# Patient Record
Sex: Male | Born: 1940 | Race: Black or African American | Hispanic: No | Marital: Married | Smoking: Never smoker
Health system: Southern US, Community
[De-identification: ages and names within clinical notes are randomized; demographics above are authoritative.]

## PROBLEM LIST (undated history)

## (undated) DIAGNOSIS — I10 Essential (primary) hypertension: Secondary | ICD-10-CM

## (undated) DIAGNOSIS — R569 Unspecified convulsions: Secondary | ICD-10-CM

## (undated) DIAGNOSIS — C61 Malignant neoplasm of prostate: Secondary | ICD-10-CM

## (undated) DIAGNOSIS — N179 Acute kidney failure, unspecified: Secondary | ICD-10-CM

## (undated) DIAGNOSIS — Q600 Renal agenesis, unilateral: Secondary | ICD-10-CM

## (undated) DIAGNOSIS — F1020 Alcohol dependence, uncomplicated: Secondary | ICD-10-CM

## (undated) DIAGNOSIS — I4510 Unspecified right bundle-branch block: Secondary | ICD-10-CM

## (undated) DIAGNOSIS — IMO0002 Reserved for concepts with insufficient information to code with codable children: Secondary | ICD-10-CM

## (undated) DIAGNOSIS — A0472 Enterocolitis due to Clostridium difficile, not specified as recurrent: Secondary | ICD-10-CM

## (undated) DIAGNOSIS — E785 Hyperlipidemia, unspecified: Secondary | ICD-10-CM

## (undated) DIAGNOSIS — N471 Phimosis: Secondary | ICD-10-CM

## (undated) HISTORY — PX: OTHER SURGICAL HISTORY: SHX169

---

## 2003-11-14 ENCOUNTER — Other Ambulatory Visit: Payer: Self-pay

## 2006-04-16 ENCOUNTER — Inpatient Hospital Stay: Payer: Self-pay | Admitting: Internal Medicine

## 2006-04-16 ENCOUNTER — Other Ambulatory Visit: Payer: Self-pay

## 2006-07-09 ENCOUNTER — Ambulatory Visit: Payer: Self-pay | Admitting: Gastroenterology

## 2006-07-16 ENCOUNTER — Ambulatory Visit: Payer: Self-pay | Admitting: Gastroenterology

## 2013-07-20 ENCOUNTER — Ambulatory Visit: Payer: Self-pay | Admitting: Physician Assistant

## 2013-07-20 LAB — CBC WITH DIFFERENTIAL/PLATELET
BASOS PCT: 0.4 %
Basophil #: 0.1 10*3/uL (ref 0.0–0.1)
Comment - H1-Com3: NORMAL
EOS ABS: 0 10*3/uL (ref 0.0–0.7)
Eosinophil %: 0.1 %
HCT: 42.8 % (ref 40.0–52.0)
HGB: 14 g/dL (ref 13.0–18.0)
LYMPHS ABS: 1.4 10*3/uL (ref 1.0–3.6)
LYMPHS PCT: 6 %
Lymphocyte %: 4.8 %
MCH: 30.5 pg (ref 26.0–34.0)
MCHC: 32.6 g/dL (ref 32.0–36.0)
MCV: 94 fL (ref 80–100)
MONO ABS: 2.6 x10 3/mm — AB (ref 0.2–1.0)
MONOS PCT: 8.6 %
Monocytes: 8 %
Myelocyte: 2 %
Neutrophil #: 25.7 10*3/uL — ABNORMAL HIGH (ref 1.4–6.5)
Neutrophil %: 86.1 %
PLATELETS: 463 10*3/uL — AB (ref 150–440)
RBC: 4.57 10*6/uL (ref 4.40–5.90)
RDW: 14.3 % (ref 11.5–14.5)
Segmented Neutrophils: 83 %
Variant Lymphocyte - H1-Rlymph: 1 %
WBC: 29.8 10*3/uL — AB (ref 3.8–10.6)

## 2013-07-20 LAB — COMPREHENSIVE METABOLIC PANEL
ALK PHOS: 120 U/L — AB
ALT: 19 U/L (ref 12–78)
ANION GAP: 15 (ref 7–16)
Albumin: 2.6 g/dL — ABNORMAL LOW (ref 3.4–5.0)
BUN: 36 mg/dL — ABNORMAL HIGH (ref 7–18)
Bilirubin,Total: 0.4 mg/dL (ref 0.2–1.0)
CALCIUM: 10 mg/dL (ref 8.5–10.1)
CHLORIDE: 96 mmol/L — AB (ref 98–107)
CO2: 27 mmol/L (ref 21–32)
CREATININE: 2.96 mg/dL — AB (ref 0.60–1.30)
EGFR (African American): 24 — ABNORMAL LOW
GFR CALC NON AF AMER: 20 — AB
GLUCOSE: 133 mg/dL — AB (ref 65–99)
Osmolality: 286 (ref 275–301)
Potassium: 3 mmol/L — ABNORMAL LOW (ref 3.5–5.1)
SGOT(AST): 16 U/L (ref 15–37)
Sodium: 138 mmol/L (ref 136–145)
Total Protein: 8.6 g/dL — ABNORMAL HIGH (ref 6.4–8.2)

## 2013-07-20 LAB — URINALYSIS, COMPLETE
Bilirubin,UR: NEGATIVE
GLUCOSE, UR: NEGATIVE mg/dL (ref 0–75)
KETONE: NEGATIVE
LEUKOCYTE ESTERASE: NEGATIVE
Nitrite: NEGATIVE
Ph: 6 (ref 4.5–8.0)
RBC,UR: >30 /HPF (ref 0–5)
Specific Gravity: 1.01 (ref 1.003–1.030)

## 2013-07-20 LAB — OCCULT BLOOD X 1 CARD TO LAB, STOOL: OCCULT BLOOD, FECES: NEGATIVE

## 2013-07-25 ENCOUNTER — Inpatient Hospital Stay: Payer: Self-pay | Admitting: Obstetrics and Gynecology

## 2013-07-25 LAB — CBC
HCT: 39.2 % — ABNORMAL LOW (ref 40.0–52.0)
HGB: 13.3 g/dL (ref 13.0–18.0)
MCH: 31.7 pg (ref 26.0–34.0)
MCHC: 33.9 g/dL (ref 32.0–36.0)
MCV: 94 fL (ref 80–100)
Platelet: 498 10*3/uL — ABNORMAL HIGH (ref 150–440)
RBC: 4.19 10*6/uL — AB (ref 4.40–5.90)
RDW: 14.3 % (ref 11.5–14.5)
WBC: 28.1 10*3/uL — ABNORMAL HIGH (ref 3.8–10.6)

## 2013-07-25 LAB — URINALYSIS, COMPLETE
BACTERIA: NONE SEEN
BILIRUBIN, UR: NEGATIVE
Glucose,UR: NEGATIVE mg/dL (ref 0–75)
Hyaline Cast: 6
Nitrite: NEGATIVE
Ph: 6 (ref 4.5–8.0)
Protein: >=500
Specific Gravity: 1.019 (ref 1.003–1.030)
Squamous Epithelial: <1

## 2013-07-25 LAB — COMPREHENSIVE METABOLIC PANEL
ALK PHOS: 126 U/L — AB
Albumin: 2.4 g/dL — ABNORMAL LOW (ref 3.4–5.0)
Anion Gap: 8 (ref 7–16)
BILIRUBIN TOTAL: 0.3 mg/dL (ref 0.2–1.0)
BUN: 21 mg/dL — AB (ref 7–18)
CO2: 29 mmol/L (ref 21–32)
Calcium, Total: 9.4 mg/dL (ref 8.5–10.1)
Chloride: 99 mmol/L (ref 98–107)
Creatinine: 1.52 mg/dL — ABNORMAL HIGH (ref 0.60–1.30)
EGFR (African American): 53 — ABNORMAL LOW
EGFR (Non-African Amer.): 46 — ABNORMAL LOW
Glucose: 121 mg/dL — ABNORMAL HIGH (ref 65–99)
OSMOLALITY: 276 (ref 275–301)
Potassium: 2.9 mmol/L — ABNORMAL LOW (ref 3.5–5.1)
SGOT(AST): 18 U/L (ref 15–37)
SGPT (ALT): 18 U/L (ref 12–78)
Sodium: 136 mmol/L (ref 136–145)
Total Protein: 8.3 g/dL — ABNORMAL HIGH (ref 6.4–8.2)

## 2013-07-25 LAB — LIPASE, BLOOD: LIPASE: 126 U/L (ref 73–393)

## 2013-07-25 LAB — CLOSTRIDIUM DIFFICILE(ARMC)

## 2013-07-26 LAB — BASIC METABOLIC PANEL
Anion Gap: 6 — ABNORMAL LOW (ref 7–16)
BUN: 14 mg/dL (ref 7–18)
CALCIUM: 8.9 mg/dL (ref 8.5–10.1)
CREATININE: 1.32 mg/dL — AB (ref 0.60–1.30)
Chloride: 103 mmol/L (ref 98–107)
Co2: 28 mmol/L (ref 21–32)
EGFR (African American): >60
EGFR (Non-African Amer.): 54 — ABNORMAL LOW
Glucose: 104 mg/dL — ABNORMAL HIGH (ref 65–99)
Osmolality: 275 (ref 275–301)
POTASSIUM: 3.3 mmol/L — AB (ref 3.5–5.1)
Sodium: 137 mmol/L (ref 136–145)

## 2013-07-26 LAB — URINE CULTURE

## 2013-07-26 LAB — CBC WITH DIFFERENTIAL/PLATELET
BASOS PCT: 0.2 %
Basophil #: 0.1 10*3/uL (ref 0.0–0.1)
EOS PCT: 0.6 %
Eosinophil #: 0.1 10*3/uL (ref 0.0–0.7)
HCT: 37.3 % — AB (ref 40.0–52.0)
HGB: 12.2 g/dL — ABNORMAL LOW (ref 13.0–18.0)
LYMPHS ABS: 1.1 10*3/uL (ref 1.0–3.6)
Lymphocyte %: 4.4 %
MCH: 31 pg (ref 26.0–34.0)
MCHC: 32.7 g/dL (ref 32.0–36.0)
MCV: 95 fL (ref 80–100)
Monocyte #: 1.9 x10 3/mm — ABNORMAL HIGH (ref 0.2–1.0)
Monocyte %: 7.4 %
NEUTROS ABS: 21.9 10*3/uL — AB (ref 1.4–6.5)
NEUTROS PCT: 87.4 %
Platelet: 512 10*3/uL — ABNORMAL HIGH (ref 150–440)
RBC: 3.93 10*6/uL — AB (ref 4.40–5.90)
RDW: 14.8 % — AB (ref 11.5–14.5)
WBC: 25.1 10*3/uL — ABNORMAL HIGH (ref 3.8–10.6)

## 2013-07-28 LAB — WBC: WBC: 21 10*3/uL — AB (ref 3.8–10.6)

## 2013-07-30 LAB — CULTURE, BLOOD (SINGLE)

## 2013-08-06 ENCOUNTER — Inpatient Hospital Stay: Payer: Self-pay | Admitting: Family Medicine

## 2013-08-06 LAB — COMPREHENSIVE METABOLIC PANEL
ALT: 15 U/L (ref 12–78)
ANION GAP: 7 (ref 7–16)
AST: 15 U/L (ref 15–37)
Albumin: 2.9 g/dL — ABNORMAL LOW (ref 3.4–5.0)
Alkaline Phosphatase: 120 U/L — ABNORMAL HIGH
BILIRUBIN TOTAL: 0.5 mg/dL (ref 0.2–1.0)
BUN: 17 mg/dL (ref 7–18)
CALCIUM: 10.4 mg/dL — AB (ref 8.5–10.1)
Chloride: 101 mmol/L (ref 98–107)
Co2: 26 mmol/L (ref 21–32)
Creatinine: 1.75 mg/dL — ABNORMAL HIGH (ref 0.60–1.30)
EGFR (Non-African Amer.): 39 — ABNORMAL LOW
GFR CALC AF AMER: 45 — AB
Glucose: 115 mg/dL — ABNORMAL HIGH (ref 65–99)
OSMOLALITY: 271 (ref 275–301)
Potassium: 4.1 mmol/L (ref 3.5–5.1)
Sodium: 134 mmol/L — ABNORMAL LOW (ref 136–145)
Total Protein: 9.2 g/dL — ABNORMAL HIGH (ref 6.4–8.2)

## 2013-08-06 LAB — URINALYSIS, COMPLETE
SQUAMOUS EPITHELIAL: NONE SEEN
Specific Gravity: 1.026 (ref 1.003–1.030)
WBC UR: 9908 /HPF (ref 0–5)

## 2013-08-06 LAB — CBC WITH DIFFERENTIAL/PLATELET
Comment - H1-Com1: NORMAL
EOS PCT: 1 %
HCT: 42.4 % (ref 40.0–52.0)
HGB: 13.3 g/dL (ref 13.0–18.0)
LYMPHS PCT: 5 %
MCH: 29.6 pg (ref 26.0–34.0)
MCHC: 31.3 g/dL — ABNORMAL LOW (ref 32.0–36.0)
MCV: 94 fL (ref 80–100)
Monocytes: 5 %
Platelet: 559 10*3/uL — ABNORMAL HIGH (ref 150–440)
RBC: 4.49 10*6/uL (ref 4.40–5.90)
RDW: 14.4 % (ref 11.5–14.5)
Segmented Neutrophils: 87 %
Variant Lymphocyte - H1-Rlymph: 2 %
WBC: 31.1 10*3/uL — AB (ref 3.8–10.6)

## 2013-08-07 LAB — CBC WITH DIFFERENTIAL/PLATELET
Basophil #: 0.1 10*3/uL (ref 0.0–0.1)
Basophil %: 0.7 %
EOS PCT: 2 %
Eosinophil #: 0.3 10*3/uL (ref 0.0–0.7)
HCT: 34.8 % — AB (ref 40.0–52.0)
HGB: 11.4 g/dL — ABNORMAL LOW (ref 13.0–18.0)
LYMPHS ABS: 2.5 10*3/uL (ref 1.0–3.6)
Lymphocyte %: 16.8 %
MCH: 30.8 pg (ref 26.0–34.0)
MCHC: 32.9 g/dL (ref 32.0–36.0)
MCV: 94 fL (ref 80–100)
Monocyte #: 1.4 x10 3/mm — ABNORMAL HIGH (ref 0.2–1.0)
Monocyte %: 9.6 %
NEUTROS PCT: 70.9 %
Neutrophil #: 10.5 10*3/uL — ABNORMAL HIGH (ref 1.4–6.5)
PLATELETS: 436 10*3/uL (ref 150–440)
RBC: 3.71 10*6/uL — AB (ref 4.40–5.90)
RDW: 14.6 % — ABNORMAL HIGH (ref 11.5–14.5)
WBC: 14.8 10*3/uL — ABNORMAL HIGH (ref 3.8–10.6)

## 2013-08-07 LAB — BASIC METABOLIC PANEL
Anion Gap: 7 (ref 7–16)
BUN: 15 mg/dL (ref 7–18)
Calcium, Total: 8.7 mg/dL (ref 8.5–10.1)
Chloride: 105 mmol/L (ref 98–107)
Co2: 23 mmol/L (ref 21–32)
Creatinine: 1.66 mg/dL — ABNORMAL HIGH (ref 0.60–1.30)
EGFR (African American): 48 — ABNORMAL LOW
EGFR (Non-African Amer.): 41 — ABNORMAL LOW
GLUCOSE: 112 mg/dL — AB (ref 65–99)
Osmolality: 272 (ref 275–301)
Potassium: 3.2 mmol/L — ABNORMAL LOW (ref 3.5–5.1)
Sodium: 135 mmol/L — ABNORMAL LOW (ref 136–145)

## 2013-08-07 LAB — CLOSTRIDIUM DIFFICILE(ARMC)

## 2013-08-08 LAB — BASIC METABOLIC PANEL
Anion Gap: 6 — ABNORMAL LOW (ref 7–16)
BUN: 9 mg/dL (ref 7–18)
CO2: 26 mmol/L (ref 21–32)
Calcium, Total: 9.1 mg/dL (ref 8.5–10.1)
Chloride: 106 mmol/L (ref 98–107)
Creatinine: 1.47 mg/dL — ABNORMAL HIGH (ref 0.60–1.30)
EGFR (African American): 55 — ABNORMAL LOW
GFR CALC NON AF AMER: 48 — AB
Glucose: 94 mg/dL (ref 65–99)
OSMOLALITY: 274 (ref 275–301)
POTASSIUM: 3.3 mmol/L — AB (ref 3.5–5.1)
SODIUM: 138 mmol/L (ref 136–145)

## 2013-08-08 LAB — CBC WITH DIFFERENTIAL/PLATELET
BASOS PCT: 0.5 %
Basophil #: 0.1 10*3/uL (ref 0.0–0.1)
EOS PCT: 1.8 %
Eosinophil #: 0.3 10*3/uL (ref 0.0–0.7)
HCT: 30.5 % — AB (ref 40.0–52.0)
HGB: 10 g/dL — AB (ref 13.0–18.0)
Lymphocyte #: 1.5 10*3/uL (ref 1.0–3.6)
Lymphocyte %: 7.6 %
MCH: 30.7 pg (ref 26.0–34.0)
MCHC: 32.8 g/dL (ref 32.0–36.0)
MCV: 94 fL (ref 80–100)
MONO ABS: 1.9 x10 3/mm — AB (ref 0.2–1.0)
MONOS PCT: 9.4 %
Neutrophil #: 15.9 10*3/uL — ABNORMAL HIGH (ref 1.4–6.5)
Neutrophil %: 80.7 %
PLATELETS: 363 10*3/uL (ref 150–440)
RBC: 3.25 10*6/uL — ABNORMAL LOW (ref 4.40–5.90)
RDW: 14.3 % (ref 11.5–14.5)
WBC: 19.7 10*3/uL — ABNORMAL HIGH (ref 3.8–10.6)

## 2013-08-08 LAB — URINE CULTURE

## 2013-08-09 ENCOUNTER — Ambulatory Visit: Payer: Self-pay | Admitting: Urology

## 2013-08-09 LAB — CBC WITH DIFFERENTIAL/PLATELET
BASOS ABS: 0.1 10*3/uL (ref 0.0–0.1)
Basophil %: 0.4 %
EOS PCT: 2.9 %
Eosinophil #: 0.4 10*3/uL (ref 0.0–0.7)
HCT: 29.6 % — ABNORMAL LOW (ref 40.0–52.0)
HGB: 9.9 g/dL — ABNORMAL LOW (ref 13.0–18.0)
Lymphocyte #: 1.3 10*3/uL (ref 1.0–3.6)
Lymphocyte %: 9.4 %
MCH: 31 pg (ref 26.0–34.0)
MCHC: 33.3 g/dL (ref 32.0–36.0)
MCV: 93 fL (ref 80–100)
MONO ABS: 1.5 x10 3/mm — AB (ref 0.2–1.0)
Monocyte %: 11.1 %
Neutrophil #: 10.2 10*3/uL — ABNORMAL HIGH (ref 1.4–6.5)
Neutrophil %: 76.2 %
Platelet: 353 10*3/uL (ref 150–440)
RBC: 3.18 10*6/uL — ABNORMAL LOW (ref 4.40–5.90)
RDW: 14.7 % — ABNORMAL HIGH (ref 11.5–14.5)
WBC: 13.4 10*3/uL — AB (ref 3.8–10.6)

## 2013-08-09 LAB — IRON AND TIBC
IRON: 52 ug/dL — AB (ref 65–175)
Iron Bind.Cap.(Total): 134 ug/dL — ABNORMAL LOW (ref 250–450)
Iron Saturation: 39 %
Unbound Iron-Bind.Cap.: 82 ug/dL

## 2013-08-09 LAB — OCCULT BLOOD X 1 CARD TO LAB, STOOL: OCCULT BLOOD, FECES: NEGATIVE

## 2013-08-09 LAB — FERRITIN: FERRITIN (ARMC): 476 ng/mL — AB (ref 8–388)

## 2013-08-10 LAB — BASIC METABOLIC PANEL
Anion Gap: 3 — ABNORMAL LOW (ref 7–16)
BUN: 3 mg/dL — AB (ref 7–18)
CALCIUM: 8.9 mg/dL (ref 8.5–10.1)
CO2: 28 mmol/L (ref 21–32)
CREATININE: 1.16 mg/dL (ref 0.60–1.30)
Chloride: 110 mmol/L — ABNORMAL HIGH (ref 98–107)
EGFR (African American): >60
EGFR (Non-African Amer.): >60
Glucose: 103 mg/dL — ABNORMAL HIGH (ref 65–99)
Osmolality: 278 (ref 275–301)
POTASSIUM: 3.3 mmol/L — AB (ref 3.5–5.1)
SODIUM: 141 mmol/L (ref 136–145)

## 2013-08-10 LAB — WBC: WBC: 13.5 10*3/uL — AB (ref 3.8–10.6)

## 2013-08-11 LAB — CULTURE, BLOOD (SINGLE)

## 2013-08-11 LAB — BASIC METABOLIC PANEL
Anion Gap: 5 — ABNORMAL LOW (ref 7–16)
BUN: 3 mg/dL — ABNORMAL LOW (ref 7–18)
Calcium, Total: 9.1 mg/dL (ref 8.5–10.1)
Chloride: 110 mmol/L — ABNORMAL HIGH (ref 98–107)
Co2: 26 mmol/L (ref 21–32)
Creatinine: 1.26 mg/dL (ref 0.60–1.30)
EGFR (African American): >60
GFR CALC NON AF AMER: 57 — AB
GLUCOSE: 102 mg/dL — AB (ref 65–99)
Osmolality: 278 (ref 275–301)
POTASSIUM: 3.4 mmol/L — AB (ref 3.5–5.1)
Sodium: 141 mmol/L (ref 136–145)

## 2013-08-12 LAB — CLOSTRIDIUM DIFFICILE(ARMC)

## 2014-05-08 ENCOUNTER — Emergency Department: Payer: Self-pay | Admitting: Emergency Medicine

## 2014-05-08 LAB — CBC
HCT: 43.4 % (ref 40.0–52.0)
HGB: 13.8 g/dL (ref 13.0–18.0)
MCH: 31.6 pg (ref 26.0–34.0)
MCHC: 31.9 g/dL — ABNORMAL LOW (ref 32.0–36.0)
MCV: 99 fL (ref 80–100)
Platelet: 218 10*3/uL (ref 150–440)
RBC: 4.38 10*6/uL — ABNORMAL LOW (ref 4.40–5.90)
RDW: 14 % (ref 11.5–14.5)
WBC: 7.9 10*3/uL (ref 3.8–10.6)

## 2014-05-08 LAB — URINALYSIS, COMPLETE
BACTERIA: NONE SEEN
BILIRUBIN, UR: NEGATIVE
Blood: NEGATIVE
Glucose,UR: NEGATIVE mg/dL (ref 0–75)
Hyaline Cast: 3
Ketone: NEGATIVE
Leukocyte Esterase: NEGATIVE
Nitrite: NEGATIVE
Ph: 5 (ref 4.5–8.0)
Protein: NEGATIVE
Specific Gravity: 1.016 (ref 1.003–1.030)
Squamous Epithelial: <1
WBC UR: 1 /HPF (ref 0–5)

## 2014-05-08 LAB — BASIC METABOLIC PANEL
ANION GAP: 8 (ref 7–16)
BUN: 25 mg/dL — AB (ref 7–18)
Calcium, Total: 8.3 mg/dL — ABNORMAL LOW (ref 8.5–10.1)
Chloride: 108 mmol/L — ABNORMAL HIGH (ref 98–107)
Co2: 22 mmol/L (ref 21–32)
Creatinine: 1.77 mg/dL — ABNORMAL HIGH (ref 0.60–1.30)
EGFR (Non-African Amer.): 41 — ABNORMAL LOW
GFR CALC AF AMER: 49 — AB
Glucose: 79 mg/dL (ref 65–99)
OSMOLALITY: 279 (ref 275–301)
POTASSIUM: 3.8 mmol/L (ref 3.5–5.1)
Sodium: 138 mmol/L (ref 136–145)

## 2014-05-08 LAB — ETHANOL: ETHANOL LVL: 48 mg/dL

## 2014-09-19 NOTE — Discharge Summary (Signed)
PATIENT NAME:  [name], [name] MR#:  [id number] DATE OF BIRTH:  01-13-1941  DATE OF ADMISSION:  07/25/2013 DATE OF DISCHARGE:  07/28/2013  PRESENTING COMPLAINT: Difficulty urinating and abdominal pain.   DISCHARGE DIAGNOSES:  1.  Severe urinary tract infection. 2.  Acute on chronic renal failure, improved.  3.  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  4.  Hypertension.   CODE STATUS: Full code.   MEDICATIONS:  1.  Lisinopril 10 mg 1/2 tablet daily.  2.  Simvastatin 40 mg at bedtime.  3.  Tamsulosin 0.4 mg p.o. daily.  4.  Trazodone 100 mg 1 to 2 tablets daily at bedtime.  5.  Gabapentin 300 mg 1 capsule 3 times a day.  6.  Calcium with vitamin D 1 tablet b.i.d.  7.  Keflex 500 mg p.o. 3 times a day.  8.  Phenazopyridine 100 mg 1 tablet 3 times a day.   DIET: Low sodium, regular consistency.   DISCHARGE INSTRUCTIONS: The patient advised to keep his appointment with urology on August 12, 2013 at [facility]. Foley care per protocol.   LABS: White count at discharge was 21,000 and on admission was 29,000. Hemoglobin is 12.2, hematocrit is 37.3. Glucose is 104, BUN is 14, creatinine is 1.32. Sodium is 137, potassium 3.3, chloride is 103, bicarbonate is 28. C. difficile negative. Blood cultures negative. CT of the abdomen and pelvis showed normal bladder, which appears thickened, indistinct and surrounded by pelvic stranding. Suspect severe UTI. No evidence of ascending urinary tract infection. No obstructive uropathy. Left kidney surgically absent. UA positive for UTI. Urine culture, gram-positive cocci.   HOSPITAL COURSE: [name] is 74 year old, African American gentleman, who came in with diarrhea and lower abdominal pain. He has history of prostate cancer, status post radiation on Flomax. He was admitted with:  1.  Systemic inflammatory response syndrome secondary to UTI. The patient was recently seen on February 22 at [facility], had difficulty urinating  and received p.o. antibiotics and a Foley catheter was placed. The patient continued to have abdominal pain, came to the Emergency Room, his white count was elevated to 29,000. CT showed changes of suspected severe UTI. He was admitted, started on IV Rocephin, urine cultures showed GPC 1000 colony-forming units. He remained afebrile. Pyridium was started. The patient's Foley was removed; however, he continued to retain urine, hence a Foley was replaced again. I did curbside neurology here. They agreed with current management and follow up with urology at the [facility] on his scheduled appointment 08/12/2013. The patient remained afebrile. White count was down to 21,000.  2.  History of prostate cancer, status post radiation on Flomax.  3.  Acute renal failure secondary to UTI, tolerating p.o. diet. Received IV fluids.  4.  Hypertension. Resume lisinopril.  5.  History of insomnia. Continue trazodone.  6.  Hyperlipidemia, on statins.  7.  Hospital stay otherwise remained stable.   CODE STATUS: The patient remained a full code.  ____________________________ Hart Rochester. Posey Pronto, MD sap:aw D: 07/29/2013 17:14:00 ET T: 07/30/2013 06:01:36 ET JOB#: [id number]  cc: Sweta Halseth A. Posey Pronto, MD, <Dictator> [facility] urology department Ilda Basset MD ELECTRONICALLY SIGNED 08/05/2013 15:26

## 2014-09-19 NOTE — Discharge Summary (Signed)
PATIENT NAME:  [name], [name] MR#:  [id number] DATE OF BIRTH:  July 07, 1940  DATE OF ADMISSION:  08/06/2013 DATE OF DISCHARGE:  08/12/2013  REASON FOR ADMISSION:  Weakness.   DISCHARGE DIAGNOSES:   1.  Systemic inflammatory response syndrome. 2.  Clostridium difficile colitis. 3.  History of prostate cancer. 4.  Urinary retention.  5.  Long-term Foley catheter for now. 6.  Acute renal failure.  7.  Hypertension.  8.  Hypercalcemia.  9.  Hyperlipidemia.  10.  Chronic weakness.  DISPOSITION:  Home.   FOLLOWUP:  [facility] and Dr. Elnoria Howard, urology, the day of discharge.   MEDICATIONS:  Lisinopril 0.5 mg daily, simvastatin 40 mg daily, Flomax 0.4 mg daily, trazodone 100 mg once a day, gabapentin 300 mg 3 times daily, calcium plus vitamin D twice daily, metronidazole 500 mg every 8 hours for another 8 days, potassium chloride 20 mEq twice daily, lactobacillus acidophilous 2 capsules 3 times a day.   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  The patient has history of hypertension, hyperlipidemia, prostate cancer, and recently diagnosed with urinary retention for what he had a Foley catheter placed.  The patient is having diarrhea 4 to 5 times a day; no mucus, no blood, positive cramping.  Since he was so weak and dehydrated, decided to come to the Emergency Department.  In the Emergency Department, the patient was admitted.  His blood pressure was 106/57.  His heart rate was around 88.  His white blood count was 31,000, and his urinalysis showed the possibility of urinary tract infection.  The patient was admitted for systemic inflammatory response syndrome, that was problem #1.  At that moment, it was thought that the patient could have a urinary tract infection versus C. diff. diarrhea.  The patient looks significantly dehydrated. and he was in acute  kidney failure.  His urinalysis showed mostly that the patient had significant amount of blood and sediment, but the culture did not have any growth.  C. diff. was positive the following day, and the patient had treatment with metronidazole.  The patient started improvement slowly.  For the past few days, the patient states that he continued to have large bowel movements, but nobody really saw them.  The patient said that he was going to the bathroom and flushing.  We asked the patient to quantify the amount of diarrhea that he had and he was not able to tell us, and then whenever he was asked to tell the nurses how was his diarrhea and let her see it, the patient really did not have any further bowel movements.  The patient was discharged at that moment with treatment for C. diff. colitis and in a really good condition.    As far as his acute kidney injury, his creatinine was 1.75 on admission.  The patient was discharged with a creatinine of 1.21.  He received IV fluids, likely acute kidney injury was secondary to the C. diff., sepsis, and also the intravascular volume depletion.   A 2nd C. diff. test was done, which was negative by the way, and that was after 5 days of treatment.  History of prostate cancer, status post radiation.     History of urinary retention.  The patient has been taking vancomycin and Zosyn for a possible urinary tract infection with indwelling Foley catheter.  Blood cultures  and urine cultures were negative for what the medications were stopped.  The patient was told he need it to go  back to the [place] to an appointment to have his Foley evaluated and possibly removed for what the patient wanted to change urology and had an  appointment with Dr. Elnoria Howard.    His hypertension was well-controlled.  His ACE inhibitor was held due to acute kidney injury and increased blood pressure right up to 140s, 150s, and occasionally 160.    His hypercalcemia was secondary to dehydration, acute  kidney injury, intravascular volume depletion for what the patient had an improvement after saline solution.   Other medical problems were stable during this hospitalization.  The patient was discharged in a really good condition with the medications mentioned above.   I spent about 45 minutes with this discharge summary on the day of discharge.    ____________________________ [facility] Sink, MD rsg:ea D: 08/16/2013 23:05:17 ET T: 08/16/2013 23:41:59 ET JOB#: [id number]  cc: Miranda Sink, MD, <Dictator> Romone Shaff America Brown MD ELECTRONICALLY SIGNED 08/24/2013 13:30

## 2014-09-19 NOTE — H&P (Signed)
PATIENT NAME:  [name], [name] MR#:  [id number] DATE OF BIRTH:  May 28, 1941  DATE OF ADMISSION:  07/25/2013  EMERGENCY ROOM PHYSICIAN:  Dr. Jacqualine Code   CHIEF COMPLAINT: Difficulty urinating and also abdominal pain.   HISTORY OF PRESENT ILLNESS: The patient is a 74 year old male patient who was originally seen at [facility] urgent care on the 22nd for  UTI symptoms.  Referred to [facility] for admission.  The patient went to [facility].  The patient had blood work done, and discharged from there with a Foley catheter and Cipro. The patient took Cipro from 22nd of February.  He took it for 3 days then started to have diarrhea, so he stopped taking it.  The patient continued to have suprapubic pain and diarrhea so the daughter called [facility] today and then they asked him  to come to Emergency Room. In the ER, the patient was found to have UTI with elevated white count up to 28 and also has hypokalemia and acute renal failure. The patient says that he has had suprapubic pain and also right groin pain for 2 or 3 days associated with diarrhea. The patient did not have any fever or chills. The patient's intake is poor due to his diarrhea. The patient was also noted to have decreased output from Foley and the patient's Foley bag was change in the Emergency Room and was given IV fluids in the ER. Right now he is having clear urine from the Foley.   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 He also had multiple exploratory laparotomies, hypertension, hyperlipidemia, neuropathy, history of prostate cancer, status post radiation. The patient did not receive any chemotherapy or surgery for the prostate. He just received radiation.  Today, the patient does not want to go to [place] and he chose to stay here.   ALLERGIES: No allergies.   SOCIAL HISTORY: No smoking. Occasional drinking, no drugs. The patient is retired    MEDICATIONS: Home  medications include: 1.  Neurontin 300 mg p.o. t.i.d. 2.  Lisinopril 10 mg half tablet daily.  3.  Simvastatin 40 mg p.o. daily.  4.  Flomax 0.4 mg daily.  5.  Iron 100 mg at bedtime.  6.  The patient also takes calcium with vitamin D 600/200 units 1 tablet p.o. b.i.d.   REVIEW OF SYSTEMS: CONSTITUTIONAL: The patient has fatigue and decreased p.o. intake. Denies any fever.  EYES: No blurred vision.  ENT: No ear pain. No epistaxis. No difficulty swallowing.  CARDIOVASCULAR: No chest pain.  PULMONARY: No shortness of breath.  GASTROINTESTINAL: Has no nausea but diarrhea for the last 2 days, and also has right lower quadrant and suprapubic pain.  GENITOURINARY: Has decreased urination noticed for the last 2 days and has a history of prostate problems and also suprapubic pain.  ENDOCRINE: No polyuria or nocturia.  INTEGUMENTARY:  Skin is warm and dry. No skin rashes.  NEUROLOGIC: No history of TIA/strokes.  PSYCHIATRIC: The patient has no history of depression.   PHYSICAL EXAMINATION: VITAL SIGNS: Temperature 98.1, heart rate 112, blood pressure 140/82, sats 96% on room air. GENERAL:  The patient is alert, awake, oriented, elderly male, not in distress, answering questions  appropriately.  HEAD: Atraumatic, normocephalic.  EYES: Pupils equally reacting to light. Extraocular movements intact.  ENT: No tympanic membrane congestion. No turbinate hypertrophy. No oropharyngeal erythema.  NECK: Normal range of motion. No JVD. No carotid bruit.  Thyroid in the midline.  CARDIOVASCULAR: S1, S2 regular. No murmurs. Slightly tachycardic. The patient's PMI is not displaced. Peripheral pulses equal in carotid, dorsalis pedis and femoral.  LUNGS: Clear to auscultation. No wheeze. No rales. Not using accessory muscles of respiration.  GASTROINTESTINAL: Abdomen: The patient has suprapubic tenderness and also right lower quadrant tenderness. No rebound tenderness.  ABDOMEN: Distended. Bowel sounds are  heard. The patient has no hernias. No CVA tenderness.  MUSCULOSKELETAL: Able to move extremities x 4. No cyanosis, no clubbing. Does have a Foley bag to the left leg draining clear urine.  SKIN: Warm and dry. No skin lesions.  NEUROLOGIC: Alert, awake, oriented.  Cranial nerves 2 through 12 are intact. Power 5/5 in upper and lower extremities. Sensations are intact. Deep tendon reflexes 2+ bilaterally.  PSYCHIATRIC: Mood and affect are within normal limits.   LABORATORY DATA: Abdominal CAT scan showed abnormal bladder with thickening surrounding by pelvic stranding, suspect severe UTI.  No obstruction. Left kidney is surgically absent. Postoperative changes in the ventral abdomen, and retained metallic particles compatible with shrapnel in the abdomen and pelvis.   UA is yellow-colored, hazy urine, 1+ blood and 1+ leukocyte esterase, 23 WBC, 73 RBC, bacteria less than 1. WBC 28.1, hemoglobin 13.3, hematocrit 39.2, platelets 498.   Lipase 126, sodium is 136, potassium 2.9, chloride 99, bicarbonate 29, BUN 21, creatinine 1.52, glucose 122. LFTs showed alkaline phosphatase is slightly up at 126.   The patient's WBC on February 22 was 29.8 and renal function showed a BUN of 36, creatinine 2.96 at that time and potassium was 3.   ASSESSMENT AND PLAN: 1.  The patient is a 74 year old male with systemic inflammatory response syndrome secondary to urinary tract infection. The patient is going to be admitted to hospitalist service, start IV Rocephin and IV fluids, follow the urine cultures. The patient has an appointment with the urologist on 03/17. If the patient has significant urine retention, the patient will need Urology follow-up here and continue the Foley at this time.  2.  History of prostate cancer status post radiation. He is on Flomax. Continue that. Now has urine retention. He has a Foley.  The patient has cystitis due to urinary tract infection.  He needs urology follow-up and possible  cystoscopy once the urine infection, resolves.  3.  Acute renal failure secondary to urinary tract infection. The patient's renal function is better compared to  Feb 22nd that is 5 days ago.but he has diarrhea and decreased p.o. intake. Continue IV fluids  and hold his lisinopril and monitor met-B.  4.  Hypokalemia due to diarrhea and poor p.o. intake. Continue IV fluids with potassium supplementation and recheck potassium in the morning.  5.  History of insomnia. Continue trazodone.  6.  History of hyperlipidemia. Continue simvastatin at 40 mg daily.  7.  Gastrointestinal prophylaxis and deep vein thrombosis prophylaxis.   TIME SPENT: About 60 minutes on this history and physical.   ____________________________ Epifanio Lesches, MD sk:dp D: 07/25/2013 12:13:22 ET T: 07/25/2013 15:32:34 ET JOB#: [id number]  cc: Epifanio Lesches, MD, <Dictator> Epifanio Lesches MD ELECTRONICALLY SIGNED 08/11/2013 12:35

## 2014-09-19 NOTE — Consult Note (Signed)
Urology Consultation Report for Consultation: Urinary Retention in pt with a complicated past urologic history MD: Epifanio Lesches, M.D.MD: Darcella Cheshire, M.D. Urologic Care: [facility]  74 y.o. AAM who was readmitted to [facility] 08/06/2013 with C. difficile colitis following a recent [facility] Admission from 07/25/2013 - 07/28/2013 for a severe UTI with urinary retention and acute on chronic renal failure.   the 2/27-07/28/2013 Admission, the pt presented with a marked leukocystosis (WBC 28.1k) and elevated Cr (1.52) after having a foley catheter placed for urinary retention (844mL obtained with gross blood at the end of bladder emptying) on 07/20/2013 at the [facility].  At that time, at the Urgent Care, the pt was found to have a marked leukocytosis (WBC 29.8k w/left shift) and markedly elevated Cr (2.96).  The UA after catheter placement demonstrated only microhematuria (>30 rbc).  The patient was transferred to the [facility] for further evaluation, however, was not felt to meet the criteria for admission, per the pt, and was sent home.   patient was taken by ambulance to the [facility] ER on 07/25/2013.  A Stone CT demonstrated bladder wall thickening with surrounding pelvic stranding consistent with a severe cystitis.  The left kidney was noted to be surgically absent. The patient responded well to IV hydration and IV Rocephin with improvement in the leukocytosis to 21k and the Cr to 1.32.  The pt failed a trial of voiding prior to discharge requiring replacement of the foley catheter.  The patient was discharged on po Keflex and his usual tamsulosin. patient re-presented to the [facility] ER on 08/06/2013 with loose, watery diarrhea and crampy abd pain.  His leukocytosis was worse at 31.1k with his Cr elevated further at 1.75.  The pt was admitted and rx'd empirically for C. difficile colitis and UTI with IV Vancomycin and Zosyn.  The pt improved promptly with his leukocystosis down to 13.4k this AM and the Cr down to  1.47 yesterday with 2224mL urine output for the past 24 hrs.  Urine and blood cultures have returned neg. C. diff antigen and PCR for toxigenic C. diff returned positive, however, active toxin was not detected. Urologic History (Pt is a difficult historian - [facility] records unavailable): patient reports progressive LUTS with urge incontinence requiring one Depends/day since mid 2013.  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  The pt denies any staging Bone Scan or CT being performed.  He was treated with EBRT in 06/2012 with 6 months of ADT.  One week following the completion of the EBRT, the pt developed worsening LUTS with difficulty voiding and was placed on tamsulosin with improvement, however, does report that his urge incontinence worsened requiring an increase to 2 Depends/day (damp when changed). The pt denies any bladder scan or bladder catheterization being performed.  Last PSA was in 06/2013 and was "less than one hundred." patient remained at his baseline status until 3-4 days prior to his visit to the Urgent Care when he developed dysuria with decreased urinary stream, PV dribbling, and lower abd pain.  He denies any gross hematuria, F/C.  The pt reports that foley placement did resolve the abd pain he was experiencing then. The pt has an appt scheduled at the [facility] on 08/12/2013, and he would like to keep that appt. per the detailed H&P by Dr. Aaron Mose. Hower on 08/06/2013 with the following additions: Denies personal  h/o urolithiasiss/p Left Nephrectomy 1967 due to shrapnel trauma experienced in [place] for Prostate Cancer and Urolithiasis T (98.32F), P (73), RR (20), BP (118/72), RA Sat (92%)WDWN AAM in NADWarm/Dry, No lesions about the head/neckNC/AT, EOMI, Anictericno masses, no bruitsCTA, nL respiratory effortRRR without M/G/R, 1+ radial pulses bilaterallysoft/flat, mild tenderness lower  abd diffusely with guarding/rebound; no palpable masses/organomegalyuncircumcised phallus with moderate phimosis, foley in good position, b/L descended testes - NT, no massesdecreased sphincter tone, flat, atrophic prostate - TENDER (R>L) - no nodules/indurationno edemanon-focalA&O x4, pleasant/cooperative per HPI  Urinary Retention - failed a trial of voiding 2 weeks ago (had 837mL PVR 07/20/2013).  Likely long-standing (has been on tamsulosin for one year, but with worsening urge incontinence) - ?exacerbated by prostatitis (see #2) Prostatitis - tender on exam today - may be exacerbating underlying bladder outlet obstruction.  Although the prostate feels atrophic on DRE, on the CT from 07/25/2013, there is a moderate amount of posterior prostate edema (R>L). Likely improved on the Zosyn, which has been d/c'd today. Prostate Cancer - difficult to ascertain his stage, based on his history. No clinical indication of metastatic disease. Phimosis - still able to retract the foreskin, however, pt reports occasional tearing with retraction Probable C. diff colitis - improving with Vanc and now po Flagyl (pt reports formed loose stool this AM)  Keep foleyContinue tamsulosinCipro 500mg  po bid x 30 daysKeep appt with Urology Clinic at the [facility].  Electronic Signatures: Darcella Cheshire (MD)  (Signed on 14-Mar-15 14:34)  Authored  Last Updated: 14-Mar-15 14:34 by Darcella Cheshire (MD)

## 2014-09-19 NOTE — H&P (Signed)
PATIENT NAME:  GIFFORD, BALLON MR#:  [id number] DATE OF BIRTH:  [birth year]  DATE OF ADMISSION:  08/06/2013  REFERRING PHYSICIAN:  Dr. Corky Downs.   PRIMARY CARE PHYSICIAN:  [place].   CHIEF COMPLAINT:  Weakness.  HISTORY OF PRESENT ILLNESS:  A 74 year old African American gentleman with a past medical history of hypertension, hyperlipidemia who is presenting with weakness.  He was recently diagnosed with urinary retention with a Foley catheter placed approximately one week ago, on antibiotic coverage for a urinary tract infection.  Over the last 2 to 3 days he has been having diarrhea 4 to 5 times daily of loose bowel movements, brown.  No mucus or blood with associated abdominal pain and cramping in quality, suprapubic, periumbilical in location, intensity 4 to 5 out of 10, nonradiating.  No worsening or relieving factors.  He denies any fevers or chills, any other further symptoms.  He however has noted some blood in his Foley catheter bag over the last 1 to 2 days as well.   REVIEW OF SYSTEMS:  CONSTITUTIONAL:  Denies fever.  Positive for fatigue, weakness.  EYES:  Denied blurred vision, double vision, eye pain.  EARS, NOSE, THROAT:  Denies tinnitus, ear pain, hearing loss.  RESPIRATORY:  Denies cough, wheeze, shortness of breath.  CARDIOVASCULAR:  No chest pain, palpitations, edema.  GASTROINTESTINAL:  Denies nausea, vomiting.  Positive for diarrhea as well as abdominal pain as described above.  GENITOURINARY:  Denies dysuria, positive for hematuria.  ENDOCRINE:  Denies nocturia or thyroid problems.  HEMATOLOGIC AND LYMPHATIC:  Denies easy bruising.  Positive for bleeding as described above.  SKIN:  Denies rash or lesion.  MUSCULOSKELETAL:  Denies pain in neck, back, shoulder, knees, hips or arthritic symptoms. NEUROLOGIC:  Denies paralysis, paresthesias.  PSYCHIATRIC:  Denies anxiety or depressive symptoms.  Otherwise, full review of systems performed by me is negative.   PAST MEDICAL  HISTORY:  Hypertension, hyperlipidemia, prostate cancer status post radiation therapy.   SOCIAL HISTORY:  Denies any tobacco.  Positive for occasional alcohol usage.  Denies drug usage.   FAMILY HISTORY:  Positive for coronary artery disease.   ALLERGIES:  No known drug allergies.   HOME MEDICATIONS:  Include lisinopril 10 mg half tab by mouth daily, Flomax 0.4 mg by mouth daily, gabapentin 300 mg by mouth 3 times daily, trazodone 100 mg 1 to 2 tabs by mouth at bedtime, simvastatin 40 mg by mouth daily, cephalexin 500 mg 1 tab 3 times daily, phenazopyridine 100 mg 3 times daily after meals, calcium 600 plus vitamin D 200 international units 1 tablet by mouth twice daily.   PHYSICAL EXAMINATION: VITAL SIGNS:  Temperature 98, heart rate 88, respirations 18, blood pressure 106/57, saturating 97% on room air.  Weight 76.7 kg, BMI 25.7.  GENERAL:  Well-nourished, well-developed, African American gentleman, currently in no acute distress.  HEAD:  Normocephalic, atraumatic.  EYES:  Pupils equal, round, reactive to light.  Extraocular movements intact.  No scleral icterus.  MOUTH:  Moist mucous membranes.  Poor dentition.  No abscess noted.  EARS, NOSE, THROAT:  Throat clear without exudates.  No external lesions.  NECK:  Supple.  No thyromegaly.  No nodules.  No JVD.  PULMONARY:  Clear to auscultation bilaterally.  No wheezes, rubs or rhonchi.  No use of accessory muscles.  Good respiratory effort.  Chest nontender to palpation.   CARDIOVASCULAR:  S1, S2, regular rate and rhythm.  No murmurs, rubs or gallops.  No edema.  Pedal pulses  2+ bilaterally.  GASTROINTESTINAL:  Soft, minimal tenderness to palpation over periumbilical region without rebound or guarding.  No motion tenderness.  No masses.  Positive bowel sounds.  No hepatosplenomegaly.  MUSCULOSKELETAL:  No swelling, clubbing, edema.  Range of motion full in all extremities. NEUROLOGIC:  Cranial nerves II through XII intact.  No gross focal  neurological deficits.  Sensation intact.  Reflexes intact.  SKIN:  No ulcerations, lesions, rash or cyanosis.  Skin warm and dry.  Turgor intact. PSYCHIATRIC:  Mood and affect within normal limits.  The patient is awake, alert and oriented x 3.  Insight and judgment intact.   LABORATORY DATA:  Sodium 134, potassium 4.1, chloride 101, bicarb 26, BUN 17, creatinine 1.75, glucose 115, total protein 9.2, albumin 2.9, alkaline phos 120, the remainder of LFTs within normal limits.  WBC 31.1, hemoglobin 13, platelets 559.  Urinalysis, WBCs 9908, RBCs 86,984 with 1+ bacteria.   ASSESSMENT AND PLAN:  A 74 year old gentleman with history of hypertension, hyperlipidemia, presenting with weakness.  1.  Systemic inflammatory response syndrome, urinary tract infection versus Clostridium difficile.  Pan culture.  Check stool studies for Clostridium difficile.  Antibiotic coverage with vancomycin in the Emergency Department already given.  We will continue that for now.  IV fluid hydration to keep mean arterial pressure greater than 65.  Follow culture data.  When cultures return can downgrade antibiotic therapy.  2.  Hypercalcemia with corrected calcium level of 11.3.  IV fluid hydration with normal saline.  We will check a parathyroid hormone as well as a vitamin D.  Follow calcium levels after IV fluid hydration.   3.  Hematuria.  No indication for transfusion at this time.  We will follow urine.  Suspect this is related to either Foley catheter versus infection.  If worsens or persists we will need urology's input and we will avoid heparin at this time.  4.  Hyperlipidemia.  Continue statin therapy.  5.  Venous thromboembolism prophylaxis with sequential compression devices.  6.  CODE STATUS:  THE PATIENT IS A FULL CODE.   TIME SPENT:  45 minutes.   Of note, the patient is a [place] patient, however does not want to be transferred there at this time.    ____________________________ Aaron Mose. Hower,  MD dkh:ea D: 08/06/2013 23:43:50 ET T: 08/07/2013 00:57:34 ET JOB#: [id number]  cc: Aaron Mose. Hower, MD, <Dictator> DAVID Woodfin Ganja MD ELECTRONICALLY SIGNED 08/07/2013 20:23

## 2016-03-09 ENCOUNTER — Inpatient Hospital Stay
Admission: EM | Admit: 2016-03-09 | Discharge: 2016-03-11 | DRG: 683 | Disposition: A | Discharge status: Home / Self Care | Payer: Medicare Other | Attending: Internal Medicine | Admitting: Internal Medicine

## 2016-03-09 ENCOUNTER — Emergency Department: Payer: Medicare Other

## 2016-03-09 ENCOUNTER — Encounter: Payer: Self-pay | Admitting: Emergency Medicine

## 2016-03-09 DIAGNOSIS — G8929 Other chronic pain: Secondary | ICD-10-CM | POA: Diagnosis not present

## 2016-03-09 DIAGNOSIS — M545 Low back pain: Secondary | ICD-10-CM | POA: Diagnosis present

## 2016-03-09 DIAGNOSIS — Z905 Acquired absence of kidney: Secondary | ICD-10-CM | POA: Diagnosis not present

## 2016-03-09 DIAGNOSIS — I129 Hypertensive chronic kidney disease with stage 1 through stage 4 chronic kidney disease, or unspecified chronic kidney disease: Secondary | ICD-10-CM | POA: Diagnosis present

## 2016-03-09 DIAGNOSIS — I4510 Unspecified right bundle-branch block: Secondary | ICD-10-CM | POA: Diagnosis not present

## 2016-03-09 DIAGNOSIS — G40909 Epilepsy, unspecified, not intractable, without status epilepticus: Secondary | ICD-10-CM | POA: Diagnosis not present

## 2016-03-09 DIAGNOSIS — I959 Hypotension, unspecified: Secondary | ICD-10-CM

## 2016-03-09 DIAGNOSIS — E872 Acidosis, unspecified: Secondary | ICD-10-CM

## 2016-03-09 DIAGNOSIS — E860 Dehydration: Secondary | ICD-10-CM | POA: Diagnosis present

## 2016-03-09 DIAGNOSIS — E785 Hyperlipidemia, unspecified: Secondary | ICD-10-CM | POA: Diagnosis present

## 2016-03-09 DIAGNOSIS — R55 Syncope and collapse: Secondary | ICD-10-CM

## 2016-03-09 DIAGNOSIS — N189 Chronic kidney disease, unspecified: Secondary | ICD-10-CM | POA: Diagnosis not present

## 2016-03-09 DIAGNOSIS — Z8546 Personal history of malignant neoplasm of prostate: Secondary | ICD-10-CM | POA: Diagnosis not present

## 2016-03-09 DIAGNOSIS — Z923 Personal history of irradiation: Secondary | ICD-10-CM

## 2016-03-09 DIAGNOSIS — I10 Essential (primary) hypertension: Secondary | ICD-10-CM | POA: Diagnosis present

## 2016-03-09 DIAGNOSIS — M6281 Muscle weakness (generalized): Secondary | ICD-10-CM

## 2016-03-09 DIAGNOSIS — R531 Weakness: Secondary | ICD-10-CM | POA: Diagnosis present

## 2016-03-09 DIAGNOSIS — N179 Acute kidney failure, unspecified: Principal | ICD-10-CM

## 2016-03-09 DIAGNOSIS — I6523 Occlusion and stenosis of bilateral carotid arteries: Secondary | ICD-10-CM

## 2016-03-09 HISTORY — DX: Alcohol dependence, uncomplicated: F10.20

## 2016-03-09 HISTORY — DX: Renal agenesis, unilateral: Q60.0

## 2016-03-09 HISTORY — DX: Phimosis: N47.1

## 2016-03-09 HISTORY — DX: Malignant neoplasm of prostate: C61

## 2016-03-09 HISTORY — DX: Acute kidney failure, unspecified: N17.9

## 2016-03-09 HISTORY — DX: Unspecified right bundle-branch block: I45.10

## 2016-03-09 HISTORY — DX: Reserved for concepts with insufficient information to code with codable children: IMO0002

## 2016-03-09 HISTORY — DX: Unspecified convulsions: R56.9

## 2016-03-09 HISTORY — DX: Enterocolitis due to Clostridium difficile, not specified as recurrent: A04.72

## 2016-03-09 HISTORY — DX: Essential (primary) hypertension: I10

## 2016-03-09 HISTORY — DX: Hyperlipidemia, unspecified: E78.5

## 2016-03-09 LAB — CBC WITH DIFFERENTIAL/PLATELET
Basophils Absolute: 0 10*3/uL (ref 0–0.1)
Basophils Relative: 1 %
EOS ABS: 0.1 10*3/uL (ref 0–0.7)
EOS PCT: 2 %
HCT: 40.1 % (ref 40.0–52.0)
Hemoglobin: 13.4 g/dL (ref 13.0–18.0)
LYMPHS ABS: 1.2 10*3/uL (ref 1.0–3.6)
Lymphocytes Relative: 17 %
MCH: 32.1 pg (ref 26.0–34.0)
MCHC: 33.3 g/dL (ref 32.0–36.0)
MCV: 96.2 fL (ref 80.0–100.0)
Monocytes Absolute: 0.6 10*3/uL (ref 0.2–1.0)
Monocytes Relative: 9 %
Neutro Abs: 5.2 10*3/uL (ref 1.4–6.5)
Neutrophils Relative %: 71 %
PLATELETS: 205 10*3/uL (ref 150–440)
RBC: 4.17 MIL/uL — AB (ref 4.40–5.90)
RDW: 14.7 % — ABNORMAL HIGH (ref 11.5–14.5)
WBC: 7.2 10*3/uL (ref 3.8–10.6)

## 2016-03-09 LAB — URINALYSIS COMPLETE WITH MICROSCOPIC (ARMC ONLY)
BILIRUBIN URINE: NEGATIVE
Bacteria, UA: NONE SEEN
GLUCOSE, UA: NEGATIVE mg/dL
HGB URINE DIPSTICK: NEGATIVE
Ketones, ur: NEGATIVE mg/dL
Nitrite: NEGATIVE
Protein, ur: NEGATIVE mg/dL
RBC / HPF: NONE SEEN RBC/hpf (ref 0–5)
SPECIFIC GRAVITY, URINE: 1.009 (ref 1.005–1.030)
pH: 5 (ref 5.0–8.0)

## 2016-03-09 LAB — LITHIUM LEVEL

## 2016-03-09 LAB — TROPONIN I
Troponin I: <0.03 ng/mL (ref <0.03)
Troponin I: 0.03 ng/mL (ref <0.03)

## 2016-03-09 LAB — COMPREHENSIVE METABOLIC PANEL
ALT: 15 U/L — ABNORMAL LOW (ref 17–63)
ANION GAP: 12 (ref 5–15)
AST: 31 U/L (ref 15–41)
Albumin: 3.9 g/dL (ref 3.5–5.0)
Alkaline Phosphatase: 68 U/L (ref 38–126)
BUN: 20 mg/dL (ref 6–20)
CHLORIDE: 108 mmol/L (ref 101–111)
CO2: 18 mmol/L — AB (ref 22–32)
Calcium: 9.2 mg/dL (ref 8.9–10.3)
Creatinine, Ser: 2.59 mg/dL — ABNORMAL HIGH (ref 0.61–1.24)
GFR calc non Af Amer: 23 mL/min — ABNORMAL LOW (ref >60)
GFR, EST AFRICAN AMERICAN: 27 mL/min — AB (ref >60)
Glucose, Bld: 133 mg/dL — ABNORMAL HIGH (ref 65–99)
Potassium: 4.2 mmol/L (ref 3.5–5.1)
SODIUM: 138 mmol/L (ref 135–145)
Total Bilirubin: 0.4 mg/dL (ref 0.3–1.2)
Total Protein: 7.5 g/dL (ref 6.5–8.1)

## 2016-03-09 LAB — BASIC METABOLIC PANEL
Anion gap: 5 (ref 5–15)
BUN: 20 mg/dL (ref 6–20)
CHLORIDE: 111 mmol/L (ref 101–111)
CO2: 24 mmol/L (ref 22–32)
CREATININE: 2.33 mg/dL — AB (ref 0.61–1.24)
Calcium: 8.9 mg/dL (ref 8.9–10.3)
GFR calc non Af Amer: 26 mL/min — ABNORMAL LOW (ref >60)
GFR, EST AFRICAN AMERICAN: 30 mL/min — AB (ref >60)
Glucose, Bld: 101 mg/dL — ABNORMAL HIGH (ref 65–99)
Potassium: 4.8 mmol/L (ref 3.5–5.1)
Sodium: 140 mmol/L (ref 135–145)

## 2016-03-09 LAB — LACTIC ACID, PLASMA: LACTIC ACID, VENOUS: 1.3 mmol/L (ref 0.5–1.9)

## 2016-03-09 LAB — PHOSPHORUS: Phosphorus: 3.1 mg/dL (ref 2.5–4.6)

## 2016-03-09 LAB — MAGNESIUM: Magnesium: 2 mg/dL (ref 1.7–2.4)

## 2016-03-09 MED ORDER — ZOLPIDEM TARTRATE 5 MG PO TABS: 5.0000 mg | ORAL_TABLET | Freq: Every evening | ORAL | Status: DC | PRN

## 2016-03-09 MED ORDER — SODIUM CHLORIDE 0.9 % IV BOLUS (SEPSIS)
1000.0000 mL | Freq: Once | INTRAVENOUS | Status: AC
  Administered 2016-03-09: 1000 mL via INTRAVENOUS

## 2016-03-09 MED ORDER — BISACODYL 5 MG PO TBEC: 5.0000 mg | DELAYED_RELEASE_TABLET | Freq: Every day | ORAL | Status: DC | PRN

## 2016-03-09 MED ORDER — ACETAMINOPHEN 650 MG RE SUPP: 650.0000 mg | Freq: Four times a day (QID) | RECTAL | Status: DC | PRN

## 2016-03-09 MED ORDER — SODIUM CHLORIDE 0.9 % IV SOLN
INTRAVENOUS | Status: DC
  Administered 2016-03-09: 23:00:00 via INTRAVENOUS

## 2016-03-09 MED ORDER — ONDANSETRON HCL 4 MG/2ML IJ SOLN: 4.0000 mg | Freq: Four times a day (QID) | INTRAMUSCULAR | Status: DC | PRN

## 2016-03-09 MED ORDER — SENNOSIDES-DOCUSATE SODIUM 8.6-50 MG PO TABS: 1.0000 | ORAL_TABLET | Freq: Every evening | ORAL | Status: DC | PRN

## 2016-03-09 MED ORDER — HYDROCODONE-ACETAMINOPHEN 5-325 MG PO TABS
1.0000 | ORAL_TABLET | ORAL | Status: DC | PRN
  Administered 2016-03-10 – 2016-03-11 (×2): 1 via ORAL
  Filled 2016-03-09 (×2): qty 1

## 2016-03-09 MED ORDER — MAGNESIUM CITRATE PO SOLN: 1.0000 | Freq: Once | ORAL | Status: DC | PRN

## 2016-03-09 MED ORDER — ASPIRIN EC 325 MG PO TBEC: 325.0000 mg | DELAYED_RELEASE_TABLET | Freq: Once | ORAL | Status: DC

## 2016-03-09 MED ORDER — ONDANSETRON HCL 4 MG PO TABS: 4.0000 mg | ORAL_TABLET | Freq: Four times a day (QID) | ORAL | Status: DC | PRN

## 2016-03-09 MED ORDER — ENOXAPARIN SODIUM 30 MG/0.3ML ~~LOC~~ SOLN
30.0000 mg | Freq: Every day | SUBCUTANEOUS | Status: DC
  Administered 2016-03-10: 30 mg via SUBCUTANEOUS
  Filled 2016-03-09: qty 0.3

## 2016-03-09 MED ORDER — ACETAMINOPHEN 325 MG PO TABS: 650.0000 mg | ORAL_TABLET | Freq: Four times a day (QID) | ORAL | Status: DC | PRN

## 2016-03-09 NOTE — H&P (Signed)
[place] @ [facility] Admission History and Physical [employer], D.O.  ---------------------------------------------------------------------------------------------------------------------   PATIENT NAME: [name] MR#: [id number] DATE OF BIRTH: [birth year] DATE OF ADMISSION: 03/09/2016 PRIMARY CARE PHYSICIAN: Pcp Not In System  REQUESTING/REFERRING PHYSICIAN: ED Dr. Quentin Cornwall  CHIEF COMPLAINT: Chief Complaint  Patient presents with  . Weakness    HISTORY OF PRESENT ILLNESS: [name] is a 75 y.o. male with a known history of Hypertension, hyperlipidemia, solitary kidney, seizures and prostate cancer presents to the emergency department complaining of dizziness, lightheadedness and right leg cramping and weakness.  Patient was in a usual state of health until this afternoon when he reports that he was lifting something heavy and had to sit down because of generalized dizziness, lightheadedness, sweating. He reports that his family helped him to a chair and indicated that he looked pale. He denies loss of consciousness but states that he felt like he was going to pass out. He reports something similar happened yesterday after which he laid down to take a nap and symptoms had resolved by time he had woken up.Marland Kitchen  He reports chronic right leg weakness and cramping which is somewhat worse today during this episode and that the weakness impaired his gait.Marland Kitchen He is currently not expressing any pain or weakness in the right lower extremity  Otherwise there has been no change in status. Patient has been taking medication as prescribed and there has been no recent change in medication or diet.  There has been no recent illness, travel or sick contacts.    Patient denies fevers/chills, chest pain, shortness of breath, N/V/C/D, abdominal pain, dysuria/frequency, changes in mental status.    PAST MEDICAL HISTORY: Past Medical History:  Diagnosis Date  . Cancer [facility])     prostate  . Hyperlipemia   . Hypertension   . Renal disorder    1 kidney  . Seizures ([place])       PAST SURGICAL HISTORY: Past Surgical History:  Procedure Laterality Date  . ABDOMINAL SURGERY     shrapnel surgery from [place] war  Left nephrectomy was done at the time of his abdominal surgery.    SOCIAL HISTORY: Social History  Substance Use Topics  . Smoking status: Never Smoker  . Smokeless tobacco: Never Used  . Alcohol use 12.6 oz/week    21 Cans of beer per week     Comment: 3 12oz cans a day      FAMILY HISTORY: History reviewed. No pertinent family history.   MEDICATIONS AT HOME: Prior to Admission medications   Not on File      DRUG ALLERGIES: No Known Allergies   REVIEW OF SYSTEMS: CONSTITUTIONAL: Positive fatigue, weakness, negative fever, chills, weight gain/loss, headache EYES: No blurry or double vision. ENT: No tinnitus, postnasal drip, redness or soreness of the oropharynx. RESPIRATORY: No dyspnea, cough, wheeze, hemoptysis. CARDIOVASCULAR: No chest pain, orthopnea, palpitations, syncope. GASTROINTESTINAL: No nausea, vomiting, constipation, diarrhea, abdominal pain. No hematemesis, melena or hematochezia. GENITOURINARY: No dysuria, frequency, hematuria. ENDOCRINE: No polyuria or nocturia. No heat or cold intolerance. HEMATOLOGY: No anemia, bruising, bleeding. INTEGUMENTARY: No rashes, ulcers, lesions. MUSCULOSKELETAL: No pain, arthritis, swelling, gout. NEUROLOGIC: No numbness, tingling, positive weakness and ataxia. No seizure-type activity. PSYCHIATRIC: No anxiety, depression, insomnia.  PHYSICAL EXAMINATION: VITAL SIGNS: Blood pressure (!) 163/69, pulse 79, temperature 98 F (36.7 C), temperature source Oral, resp. rate 18, height 5\' 8"  (1.727 m), weight 83 kg (183 lb), SpO2 100 %.  GENERAL: 75 y.o.-year-old black male patient, well-developed,  well-nourished lying in the bed in no acute distress.  Pleasant and cooperative.   HEENT: Head  atraumatic, normocephalic. Pupils equal, round, reactive to light and accommodation. No scleral icterus. Extraocular muscles intact. Oropharynx is clear. Mucus membranes moist. Edentulous NECK: Supple, full range of motion. No JVD, no bruit heard. No cervical lymphadenopathy. CHEST: Normal breath sounds bilaterally. No wheezing, rales, rhonchi or crackles. No use of accessory muscles of respiration.  No reproducible chest wall tenderness.  CARDIOVASCULAR: S1, S2 normal. No murmurs, rubs, or gallops appreciated. Cap refill <2 seconds. ABDOMEN: Soft, nontender, nondistended. No rebound, guarding, rigidity. Normoactive bowel sounds present in all four quadrants. No organomegaly or mass. EXTREMITIES: Full range of motion. No pedal edema, cyanosis, or clubbing. NEUROLOGIC: Cranial nerves II through XII are grossly intact with no focal sensorimotor deficit. Muscle strength 5/5 in all extremities. Sensation intact. Gait not checked. PSYCHIATRIC: The patient is alert and oriented x 3. Normal affect, mood, thought content. SKIN: Warm, dry, and intact without obvious rash, lesion, or ulcer.  LABORATORY PANEL:  CBC  Recent Labs Lab 03/09/16 1744  WBC 7.2  HGB 13.4  HCT 40.1  PLT 205   ----------------------------------------------------------------------------------------------------------------- Chemistries  Recent Labs Lab 03/09/16 1744 03/09/16 2200  NA 138 140  K 4.2 4.8  CL 108 111  CO2 18* 24  GLUCOSE 133* 101*  BUN 20 20  CREATININE 2.59* 2.33*  CALCIUM 9.2 8.9  AST 31  --   ALT 15*  --   ALKPHOS 68  --   BILITOT 0.4  --    ------------------------------------------------------------------------------------------------------------------ Cardiac Enzymes  Recent Labs Lab 03/09/16 2200  TROPONINI 0.03*   ------------------------------------------------------------------------------------------------------------------  RADIOLOGY: Dg Chest 2 View  Result Date:  03/09/2016 CLINICAL DATA:  75 year old male with sudden onset weakness, right lower extremity pain after lifting a heavy objects. Lightheadedness and diaphoresis. Initial encounter. EXAM: CHEST  2 VIEW COMPARISON:  CT Abdomen and Pelvis 07/25/2013. FINDINGS: Seated AP and lateral views of the chest. Stable moderate elevation of the right hemidiaphragm. Small retrocardiac previously only fat containing hiatal hernia might have increased. Other mediastinal contours are normal. Visualized tracheal air column is within normal limits. No pneumothorax, pulmonary edema, pleural effusion or confluent pulmonary opacity. No pneumoperitoneum. Negative visible bowel gas pattern. No acute osseous abnormality identified. IMPRESSION: No acute cardiopulmonary abnormality. Electronically Signed   By: Genevie Ann M.D.   On: 03/09/2016 18:26   US Renal  Result Date: 03/09/2016 CLINICAL DATA:  75 y/o M; single kidney with acute kidney injury concerning for obstruction. EXAM: RENAL / URINARY TRACT ULTRASOUND COMPLETE COMPARISON:  07/25/2013 CT of abdomen and pelvis. FINDINGS: Right Kidney: Length: 11.1 cm. Echogenicity within normal limits. No mass or hydronephrosis visualized. Left Kidney: Resected. Bladder: Appears normal for degree of bladder distention. Right ureteral jet noted. IMPRESSION: No hydronephrosis. Status post left nephrectomy. Otherwise unremarkable renal ultrasound. Electronically Signed   By: Kristine Garbe M.D.   On: 03/09/2016 22:11    EKG: Normal sinus rhythm at 95 bpm with normal axis and nonspecific ST-T wave changes. 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 male with a history of Hypertension, hyperlipidemia, solitary kidney, seizures and prostate cancer  now being admitted with: 1. Near syncope, possibly vasovagal, dehydration, cardiogenic-admit to inpatient with telemetry monitoring, will check troponins, lipids, TSH. Will obtain carotid  and echo. Will give aspirin 325 now.   2. AKI on CKD - unsure of baseline as [facility].  Gentle IV fluid hydration. I have also ordered urine electrolytes, renal US and a nephrology consult given his solitary kidney. 3. History of hypertension -  4. History of hyperlipidemia 5. History of seizure disorder  Diet/Nutrition: Heart Healthy Fluids: IVNS DVT Px: Lovenox, SCDs and early ambulation Code Status: Full  All the records are reviewed and case discussed with ED provider. Management plans discussed with the patient and/or family who express understanding and agree with plan of care.   TOTAL TIME TAKING CARE OF THIS PATIENT: 60 minutes.   Jaimon Bugaj D.O. on 03/09/2016 at 11:41 PM Between 7am to 6pm - Pager - [phone] After 6pm go to [email or web address] - [employer] [facility] Office [phone] CC: Primary care physician; Pcp Not In System     Note: This dictation was prepared with Dragon dictation along with smaller phrase technology. Any transcriptional errors that result from this process are unintentional.
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
## 2016-03-09 NOTE — ED Triage Notes (Signed)
Pt to ED via EMS from home c/o sudden onset weakness and right leg cramping after working outside lifting heavy objects.  Pt denies LOC.  States became light headed and diaphoretic and was helped into house by family.  Pt only c/o lower back pain states is chronic.  EMS vitals 97/48, 96 HR, 169 CBG, sinus rhythm.  Pt presents A&Ox4, speaking in complete and coherent sentences, chest rise even and unlabored, and in NAD at this time.

## 2016-03-09 NOTE — ED Notes (Signed)
Transporting patient to room 159-1A

## 2016-03-09 NOTE — ED Notes (Signed)
Patient transported to Ultrasound 

## 2016-03-10 ENCOUNTER — Inpatient Hospital Stay: Payer: Medicare Other

## 2016-03-10 ENCOUNTER — Inpatient Hospital Stay (HOSPITAL_COMMUNITY)
Admit: 2016-03-10 | Discharge: 2016-03-10 | Disposition: A | Discharge status: Home / Self Care | Payer: Medicare Other | Attending: Cardiovascular Disease | Admitting: Cardiovascular Disease

## 2016-03-10 DIAGNOSIS — E7800 Pure hypercholesterolemia, unspecified: Secondary | ICD-10-CM

## 2016-03-10 DIAGNOSIS — Z905 Acquired absence of kidney: Secondary | ICD-10-CM

## 2016-03-10 DIAGNOSIS — E872 Acidosis, unspecified: Secondary | ICD-10-CM

## 2016-03-10 DIAGNOSIS — N179 Acute kidney failure, unspecified: Secondary | ICD-10-CM | POA: Diagnosis not present

## 2016-03-10 DIAGNOSIS — I6523 Occlusion and stenosis of bilateral carotid arteries: Secondary | ICD-10-CM

## 2016-03-10 DIAGNOSIS — R531 Weakness: Secondary | ICD-10-CM | POA: Diagnosis not present

## 2016-03-10 DIAGNOSIS — I959 Hypotension, unspecified: Secondary | ICD-10-CM | POA: Diagnosis not present

## 2016-03-10 DIAGNOSIS — I10 Essential (primary) hypertension: Secondary | ICD-10-CM | POA: Diagnosis present

## 2016-03-10 DIAGNOSIS — R55 Syncope and collapse: Secondary | ICD-10-CM

## 2016-03-10 DIAGNOSIS — E785 Hyperlipidemia, unspecified: Secondary | ICD-10-CM | POA: Diagnosis present

## 2016-03-10 LAB — BASIC METABOLIC PANEL
Anion gap: 6 (ref 5–15)
BUN: 20 mg/dL (ref 6–20)
CHLORIDE: 115 mmol/L — AB (ref 101–111)
CO2: 22 mmol/L (ref 22–32)
Calcium: 8.4 mg/dL — ABNORMAL LOW (ref 8.9–10.3)
Creatinine, Ser: 2.31 mg/dL — ABNORMAL HIGH (ref 0.61–1.24)
GFR calc Af Amer: 31 mL/min — ABNORMAL LOW (ref >60)
GFR calc non Af Amer: 26 mL/min — ABNORMAL LOW (ref >60)
GLUCOSE: 98 mg/dL (ref 65–99)
POTASSIUM: 4 mmol/L (ref 3.5–5.1)
Sodium: 143 mmol/L (ref 135–145)

## 2016-03-10 LAB — ECHOCARDIOGRAM COMPLETE
Height: 68 in
Weight: 2928 oz

## 2016-03-10 LAB — TSH: TSH: 1.662 u[IU]/mL (ref 0.350–4.500)

## 2016-03-10 LAB — LIPID PANEL
CHOL/HDL RATIO: 3 ratio
Cholesterol: 175 mg/dL (ref 0–200)
HDL: 59 mg/dL (ref >40)
LDL CALC: 107 mg/dL — AB (ref 0–99)
Triglycerides: 43 mg/dL (ref <150)
VLDL: 9 mg/dL (ref 0–40)

## 2016-03-10 LAB — CBC
HEMATOCRIT: 33 % — AB (ref 40.0–52.0)
Hemoglobin: 11.3 g/dL — ABNORMAL LOW (ref 13.0–18.0)
MCH: 33.1 pg (ref 26.0–34.0)
MCHC: 34.4 g/dL (ref 32.0–36.0)
MCV: 96.2 fL (ref 80.0–100.0)
Platelets: 224 10*3/uL (ref 150–440)
RBC: 3.43 MIL/uL — ABNORMAL LOW (ref 4.40–5.90)
RDW: 14.7 % — AB (ref 11.5–14.5)
WBC: 6.7 10*3/uL (ref 3.8–10.6)

## 2016-03-10 LAB — TROPONIN I
Troponin I: <0.03 ng/mL (ref <0.03)
Troponin I: <0.03 ng/mL (ref <0.03)

## 2016-03-10 MED ORDER — NITROGLYCERIN 0.4 MG SL SUBL: 0.4000 mg | SUBLINGUAL_TABLET | SUBLINGUAL | Status: DC | PRN
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
## 2016-03-10 NOTE — Progress Notes (Signed)
*  PRELIMINARY RESULTS* Echocardiogram 2D Echocardiogram has been performed.  [name] 03/10/2016, 3:23 PM

## 2016-03-10 NOTE — Progress Notes (Signed)
Chaplain rounding visited patient who was with his son and provided spiritual support and prayers for the patient and his son.     03/10/16 1500  Clinical Encounter Type  Visited With Patient  Visit Type Initial  Referral From Nurse  Spiritual Encounters  Spiritual Needs Prayer

## 2016-03-10 NOTE — Care Management Important Message (Signed)
Important Message  Patient Details  Name: [name] MRN: [id number] Date of Birth: Sep 20, 1940   Medicare Important Message Given:  Yes    Jolly Mango, RN 03/10/2016, 10:32 AM

## 2016-03-10 NOTE — Progress Notes (Signed)
[place] at [facility]                                                                                                                                                                                            Patient Demographics   [name], is a 75 y.o. male, DOB - [birth year], [id number]  Admit date - 03/09/2016   Admitting Physician Harvie Bridge, DO  Outpatient Primary MD for the patient is Pcp Not In System   LOS - 1  Subjective: Patient admitted with needing near syncope and weakness. He reports that he has chronic issues with his lower extremity related to injury in the [place]. But now his legs are little more weaker. Patient was noted to have acute renal failure he has a history of having solitary kidney. He denies any chest pains or palpitations no nausea or vomiting    Review of Systems:   CONSTITUTIONAL: No documented fever. Positive fatigue, positive weakness. No weight gain, no weight loss.  EYES: No blurry or double vision.  ENT: No tinnitus. No postnasal drip. No redness of the oropharynx.  RESPIRATORY: No cough, no wheeze, no hemoptysis. No dyspnea.  CARDIOVASCULAR: No chest pain. No orthopnea. No palpitations. No syncope.  GASTROINTESTINAL: No nausea, no vomiting or diarrhea. No abdominal pain. No melena or hematochezia.  GENITOURINARY: No dysuria or hematuria.  ENDOCRINE: No polyuria or nocturia. No heat or cold intolerance.  HEMATOLOGY: No anemia. No bruising. No bleeding.  INTEGUMENTARY: No rashes. No lesions.  MUSCULOSKELETAL: No arthritis. No swelling. No gout.  NEUROLOGIC: No numbness, tingling, or ataxia. No seizure-type activity. Chronic lower extremity weakness PSYCHIATRIC: No anxiety. No insomnia. No ADD.    Vitals:   Vitals:   03/09/16 2250 03/10/16 0104 03/10/16 0420 03/10/16 0843  BP: (!) 163/69 (!) 115/43 (!) 103/45 116/65  Pulse: 79 80 80 81  Resp: 18 18 18 16   Temp: 98 F (36.7 C) 97.5 F (36.4  C) 98.2 F (36.8 C) 98.7 F (37.1 C)  TempSrc: Oral Oral Oral Oral  SpO2: 100% 93% 96% 95%  Weight:      Height:        Wt Readings from Last 3 Encounters:  03/09/16 183 lb (83 kg)     Intake/Output Summary (Last 24 hours) at 03/10/16 1226 Last data filed at 03/10/16 0900  Gross per 24 hour  Intake            947.5 ml  Output              200 ml  Net  747.5 ml    Physical Exam:   GENERAL: Pleasant-appearing in no apparent distress.  HEAD, EYES, EARS, NOSE AND THROAT: Atraumatic, normocephalic. Extraocular muscles are intact. Pupils equal and reactive to light. Sclerae anicteric. No conjunctival injection. No oro-pharyngeal erythema.  NECK: Supple. There is no jugular venous distention. No bruits, no lymphadenopathy, no thyromegaly.  HEART: Regular rate and rhythm,. No murmurs, no rubs, no clicks.  LUNGS: Clear to auscultation bilaterally. No rales or rhonchi. No wheezes.  ABDOMEN: Soft, flat, nontender, nondistended. Has good bowel sounds. No hepatosplenomegaly appreciated.  EXTREMITIES: No evidence of any cyanosis, clubbing, or peripheral edema.  +2 pedal and radial pulses bilaterally.  NEUROLOGIC: The patient is alert, awake, and oriented x3 left leg more weaker than the right which is chronic for him SKIN: Moist and warm with no rashes appreciated.  Psych: Not anxious, depressed LN: No inguinal LN enlargement    Antibiotics   Anti-infectives    None      Medications   Scheduled Meds: . aspirin EC  325 mg Oral Once  . enoxaparin (LOVENOX) injection  30 mg Subcutaneous QHS   Continuous Infusions: . sodium chloride 75 mL/hr at 03/09/16 2245   PRN Meds:.acetaminophen **OR** acetaminophen, bisacodyl, HYDROcodone-acetaminophen, magnesium citrate, nitroGLYCERIN, ondansetron **OR** ondansetron (ZOFRAN) IV, senna-docusate, zolpidem   Data Review:   Micro Results No results found for this or any previous visit (from the past 240 hour(s)).  Radiology  Reports Dg Chest 2 View  Result Date: 03/09/2016 CLINICAL DATA:  75 year old male with sudden onset weakness, right lower extremity pain after lifting a heavy objects. Lightheadedness and diaphoresis. Initial encounter. EXAM: CHEST  2 VIEW COMPARISON:  CT Abdomen and Pelvis 07/25/2013. FINDINGS: Seated AP and lateral views of the chest. Stable moderate elevation of the right hemidiaphragm. Small retrocardiac previously only fat containing hiatal hernia might have increased. Other mediastinal contours are normal. Visualized tracheal air column is within normal limits. No pneumothorax, pulmonary edema, pleural effusion or confluent pulmonary opacity. No pneumoperitoneum. Negative visible bowel gas pattern. No acute osseous abnormality identified. IMPRESSION: No acute cardiopulmonary abnormality. Electronically Signed   By: Genevie Ann M.D.   On: 03/09/2016 18:26   US Renal  Result Date: 03/09/2016 CLINICAL DATA:  75 y/o M; single kidney with acute kidney injury concerning for obstruction. EXAM: RENAL / URINARY TRACT ULTRASOUND COMPLETE COMPARISON:  07/25/2013 CT of abdomen and pelvis. FINDINGS: Right Kidney: Length: 11.1 cm. Echogenicity within normal limits. No mass or hydronephrosis visualized. Left Kidney: Resected. Bladder: Appears normal for degree of bladder distention. Right ureteral jet noted. IMPRESSION: No hydronephrosis. Status post left nephrectomy. Otherwise unremarkable renal ultrasound. Electronically Signed   By: Kristine Garbe M.D.   On: 03/09/2016 22:11   US Carotid Bilateral  Result Date: 03/10/2016 CLINICAL DATA:  Syncope. EXAM: BILATERAL CAROTID DUPLEX ULTRASOUND TECHNIQUE: Pearline Cables scale imaging, color Doppler and duplex ultrasound were performed of bilateral carotid and vertebral arteries in the neck. COMPARISON:  None. FINDINGS: Criteria: Quantification of carotid stenosis is based on velocity parameters that correlate the residual internal carotid diameter with NASCET-based  stenosis levels, using the diameter of the distal internal carotid lumen as the denominator for stenosis measurement. The following velocity measurements were obtained: RIGHT ICA:  155 cm/sec CCA:  [id number] cm/sec SYSTOLIC ICA/CCA RATIO:  1.3 DIASTOLIC ICA/CCA RATIO:  1.2 ECA:  142 cm/sec LEFT ICA:  110 cm/sec CCA:  [id number] cm/sec SYSTOLIC ICA/CCA RATIO:  0.9 DIASTOLIC ICA/CCA RATIO:  1.0 ECA:  104 cm/sec RIGHT CAROTID ARTERY: Echogenic and  heterogeneous plaque at the right carotid bulb. Echogenic plaque in the proximal internal carotid artery. There appears to be close to 50% narrowing of the mid right internal carotid artery due to calcified plaque based on the color Doppler images. The peak systolic velocity in this area is also mildly elevated measuring up to 155 cm/sec. RIGHT VERTEBRAL ARTERY: Antegrade flow and normal waveform in the right vertebral artery. LEFT CAROTID ARTERY: Intimal thickening at the left carotid bulb. Small amount of echogenic plaque in the left internal carotid artery. Normal waveforms and velocities in left internal carotid artery. LEFT VERTEBRAL ARTERY: Antegrade flow and normal waveform in the left vertebral artery. 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 Peak systolic velocity in the right internal carotid artery is slightly elevated but the right carotid artery ratios are less than 2. Suspect that the degree of stenosis in the right internal carotid artery is close to 50%. Estimated degree of stenosis in the left internal carotid artery is less than 50%. Patent vertebral arteries. Electronically Signed   By: Markus Daft M.D.   On: 03/10/2016 12:02     CBC  Recent Labs Lab 03/09/16 1744 03/10/16 0501  WBC 7.2 6.7  HGB 13.4 11.3*  HCT 40.1 33.0*  PLT 205 224  MCV 96.2 96.2  MCH 32.1 33.1  MCHC 33.3 34.4  RDW 14.7* 14.7*  LYMPHSABS 1.2  --   MONOABS 0.6  --   EOSABS 0.1  --   BASOSABS 0.0  --     Chemistries    Recent Labs Lab 03/09/16 1744 03/09/16 2200 03/10/16 0501  NA 138 140 143  K 4.2 4.8 4.0  CL 108 111 115*  CO2 18* 24 22  GLUCOSE 133* 101* 98  BUN 20 20 20   CREATININE 2.59* 2.33* 2.31*  CALCIUM 9.2 8.9 8.4*  MG 2.0  --   --   AST 31  --   --   ALT 15*  --   --   ALKPHOS 68  --   --   BILITOT 0.4  --   --    ------------------------------------------------------------------------------------------------------------------ estimated creatinine clearance is 29.9 mL/min (by C-G formula based on SCr of 2.31 mg/dL (H)). ------------------------------------------------------------------------------------------------------------------ No results for input(s): HGBA1C in the last 72 hours. ------------------------------------------------------------------------------------------------------------------  Recent Labs  03/10/16 0501  CHOL 175  HDL 59  LDLCALC 107*  TRIG 43  CHOLHDL 3.0   ------------------------------------------------------------------------------------------------------------------  Recent Labs  03/10/16 0019  TSH 1.662   ------------------------------------------------------------------------------------------------------------------ No results for input(s): VITAMINB12, FOLATE, FERRITIN, TIBC, IRON, RETICCTPCT in the last 72 hours.  Coagulation profile No results for input(s): INR, PROTIME in the last 168 hours.  No results for input(s): DDIMER in the last 72 hours.  Cardiac Enzymes  Recent Labs Lab 03/09/16 2200 03/10/16 0019 03/10/16 0501  TROPONINI 0.03* <0.03 <0.03   ------------------------------------------------------------------------------------------------------------------ Invalid input(s): POCBNP    Assessment & Plan   This is a 75 y.o. male with a history of Hypertension, hyperlipidemia, solitary kidney, seizures and prostate cancer  now being admitted with: 1. Near syncope, possibly vasovagal, dehydration, cardiogenic- No  evidence of arrhythmia noted on telemetry continue to monitor Echocardiogram of the heart pending Cardiology consult pending No significant carotid artery stenosis 2. AKI on CKD - unsure of baseline as all care is at [place].  Ultrasound of the kidney showed solitary kidney with baseline creatinine not available We will ask nephrology to follow Continue IV hydration 3. History of hypertension - blood pressure  currently stable off medications 4. History of hyperlipidemia check a lipid panel in the a.m. obtain his home medication regimen 5. History of seizure disorder obtain his home medication regimen to restart his seizure medication 6. Generalized weakness I'll asked physical therapy to see if patient continues to have significant weakness in his lower extremity would consider MRI of his lumbar spine      Code Status Orders        Start     Ordered   03/09/16 2230  Full code  Continuous     03/09/16 2229    Code Status History    Date Active Date Inactive Code Status Order ID Comments User Context   This patient has a current code status but no historical code status.           Consults  nephrology   DVT Prophylaxis  Lovenox   Lab Results  Component Value Date   PLT 224 03/10/2016     Time Spent in minutes   31min Greater than 50% of time spent in care coordination and counseling patient regarding the condition and plan of care.   Dustin Flock M.D on 03/10/2016 at 12:26 PM  Between 7am to 6pm - Pager - [phone]  After 6pm go to [email or web address] - password [email or web address] [facility] [place] [facility]   Office  [phone]

## 2016-03-11 DIAGNOSIS — R531 Weakness: Secondary | ICD-10-CM | POA: Diagnosis not present

## 2016-03-11 DIAGNOSIS — N179 Acute kidney failure, unspecified: Secondary | ICD-10-CM | POA: Diagnosis not present

## 2016-03-11 MED ORDER — HYDROCODONE-ACETAMINOPHEN 5-325 MG PO TABS: 1.0000 | ORAL_TABLET | Freq: Three times a day (TID) | ORAL | 0 refills | Status: DC | PRN

## 2016-03-11 NOTE — Discharge Instructions (Signed)
F/u PCP  Regarding yoiur back pain if it dose not improve in the next few days DO not do any heavy lifting or work at home

## 2016-03-11 NOTE — Evaluation (Signed)
Physical Therapy Evaluation Patient Details Name: [name] MRN: [id number] DOB: Aug 07, 1940 Today's Date: 03/11/2016   History of Present Illness  Pt is a 75 y.o. male presenting to hospital with weakness and R leg cramping associated with lightheadedness and diaphoresis after he was lifting heavy objects outside.  Pt admitted with near syncopal event with associated hypotension.  PMH includes htn, multiple abdominal surgeries and L leg injury s/p shrapnel wounds from [place].  Clinical Impression  Pt modified independent with bed mobility, independent with transfers, independent with gait x200 feet no AD or AFO (pt wears L AFO in community but not in home and does not have AFO currently in hospital); and modified independent navigating 4 stairs with railing.  Pt reports feeling like he is at his baseline.  No loss of balance noted during sessions activities.  Pt appear safe to discharge home.  No further PT services indicated in hospital or upon discharge at this time (CM and nursing notified).  Will complete current PT order.  Please re-consult PT if pt's status changes and requires PT intervention.    Follow Up Recommendations No PT follow up    Equipment Recommendations  None recommended by PT    Recommendations for Other Services       Precautions / Restrictions Precautions Precautions: Fall Precaution Comments: Seizure Restrictions Weight Bearing Restrictions: No      Mobility  Bed Mobility Overal bed mobility: Modified Independent             General bed mobility comments: Supine to sit with HOB elevated.  Transfers Overall transfer level: Independent Equipment used: None             General transfer comment: Sit to/from stand and stand step turn no AD without any difficulties or loss of balance  Ambulation/Gait Ambulation/Gait assistance: Independent Ambulation Distance (Feet): 200 Feet Assistive device: None Gait Pattern/deviations: Step-through  pattern   Gait velocity interpretation: at or above normal speed for age/gender General Gait Details: decreased push-off L LE; steady without loss of balance  Stairs Stairs: Yes Stairs assistance: Modified independent (Device/Increase time) Stair Management: One rail Right;Alternating pattern;Forwards Number of Stairs: 4 General stair comments: steady without loss of balance; mild increased time descending but does it safely  Wheelchair Mobility    Modified Rankin (Stroke Patients Only)       Balance Overall balance assessment: No apparent balance deficits (not formally assessed) (No loss of balance with ambulation and head turns R/L/up/down, increasing/decreasing speed, and turning and stopping)                                           Pertinent Vitals/Pain Pain Assessment: 0-10 Pain Score: 8  Pain Location: chronic LBP Pain Descriptors / Indicators: Sore Pain Intervention(s): Limited activity within patient's tolerance;Monitored during session;Repositioned;Patient requesting pain meds-RN notified  HR 68-88 bpm and O2 >94% on RA during session.    Home Living Family/patient expects to be discharged to:: Private residence Living Arrangements: Spouse/significant other Available Help at Discharge: Family Type of Home: House Home Access: Stairs to enter   [employer] of Steps: 3-5 STE no rail (but has wall he uses to steady himself Home Layout: One level Home Equipment: None      Prior Function Level of Independence: Independent with assistive device(s)         Comments: Pt independent except used L AFO  in community (but not in house).  Pt denies any falls in past 6 months.     Hand Dominance        Extremity/Trunk Assessment   Upper Extremity Assessment: Overall WFL for tasks assessed           Lower Extremity Assessment: RLE deficits/detail;LLE deficits/detail RLE Deficits / Details: hip flexion, knee flexion/extension,  and DF at least 4+/5 LLE Deficits / Details: L hip flexion 4/5; L knee flexion/extension 4+/5; L DF 2+/5; L PF 1/5  Cervical / Trunk Assessment: Normal  Communication   Communication: No difficulties  Cognition Arousal/Alertness: Awake/alert Behavior During Therapy: WFL for tasks assessed/performed Overall Cognitive Status: Within Functional Limits for tasks assessed                      General Comments General comments (skin integrity, edema, etc.): Pt laying in bed upon PT entering room and reporting not getting up OOB since being admitted to hospital.  Pt agreeable to PT session.    Exercises     Assessment/Plan    PT Assessment Patent does not need any further PT services  PT Problem List            PT Treatment Interventions      PT Goals (Current goals can be found in the Care Plan section)  Acute Rehab PT Goals Patient Stated Goal: to go home PT Goal Formulation: With patient Time For Goal Achievement: 03/25/16 Potential to Achieve Goals: Good    Frequency     Barriers to discharge        Co-evaluation               End of Session Equipment Utilized During Treatment: Gait belt Activity Tolerance: Patient tolerated treatment well Patient left: in chair;with call bell/phone within reach;with chair alarm set Nurse Communication: Mobility status         Time: [id number] PT Time Calculation (min) (ACUTE ONLY): 15 min   Charges:   PT Evaluation $PT Eval Low Complexity: 1 Procedure     PT G CodesLeitha Bleak 20-Mar-2016, 9:20 AM Leitha Bleak, [place]

## 2016-03-11 NOTE — Progress Notes (Signed)
Patient is to be discharged home today. Patient is in no acute distress at this time, and assessment is unchanged from this morning. Patient's IV is out, discharge paperwork has been discussed with patient/family and there are no questions or concerns at this time. Patient will be accompanied downstairs by staff and family via wheelchair.   

## 2016-03-11 NOTE — Progress Notes (Signed)
Discharge home with son. 

## 2016-03-11 NOTE — Discharge Summary (Signed)
[facility] [place] [facility] NAME: [name]    MR#:  [id number]  DATE OF BIRTH:  1940/09/11  DATE OF ADMISSION:  03/09/2016 ADMITTING PHYSICIAN: Harvie Bridge, DO  DATE OF DISCHARGE: 03/11/16  PRIMARY CARE PHYSICIAN: Pcp Not In System    ADMISSION DIAGNOSIS:  weakness  DISCHARGE DIAGNOSIS:    SECONDARY DIAGNOSIS:   Past Medical History:  Diagnosis Date  . Acute kidney injury ([place])    a. 07/2013 in setting of C diff;  b. 02/2016.  . C. difficile colitis    a. 07/2013.  Marland Kitchen EtOH dependence (HCC)    a. 3 cans of beer/day.  . Hyperlipemia   . Hypertension   . Phimosis    a. 09/2014 s/p circumcision.  . Prostate cancer ([place])    a. 2015 s/p radiation.  . RBBB   . Seizures ([place])   . Solitary kidney    [name].    HOSPITAL COURSE:  75 y.o.malewith a history of Hypertension, hyperlipidemia, solitary kidney, seizures and prostate cancernow being admitted with: 1. Near syncope, possibly vasovagal, dehydration -No evidence of arrhythmia noted on telemetry continue to monitor Echocardiogram shows normal LV function Cardiology consult appreciated. No further cardic w/u needed No significant carotid artery stenosis -pt feels well  2. AKI on CKD - unsure of baseline as all care is at [place].  Ultrasound of the kidney showed solitary kidney with baseline creatinine not available Received IVF. Creat appears at baseline  3. History of hypertension - blood pressure currently stable off medications  4. History of hyperlipidemia   5. History of seizure disorder -not on any chronic meds (perf med list)  6. Generalized weakness  -pt did very well with PT this morning. No further PT needs -Pt has h/o chronic back pain and it seems got aggravated after he was moving a heavy exercise cycle at home before coming here He is recoomeded to not do any heavy lifting and if symptoms donot improve then see his PCP at [facility] for  further w/u -denies any tingling or numbness or and bowel/urinnary incont  Overall stable D/c home  CONSULTS OBTAINED:  Treatment Team:  Minna Merritts, MD Murlean Iba, MD  DRUG ALLERGIES:  No Known Allergies  DISCHARGE MEDICATIONS:   Current Discharge Medication List    START taking these medications   Details  HYDROcodone-acetaminophen (NORCO/VICODIN) 5-325 MG tablet Take 1 tablet by mouth every 8 (eight) hours as needed for moderate pain. Qty: 15 tablet, Refills: 0      CONTINUE these medications which have NOT CHANGED   Details  aspirin 81 MG chewable tablet Chew 81 mg by mouth every morning.    FLUoxetine (PROZAC) 20 MG capsule Take 40 mg by mouth every morning.    pantoprazole (PROTONIX) 40 MG tablet Take 40 mg by mouth every morning.    tamsulosin (FLOMAX) 0.4 MG CAPS capsule Take 0.4 mg by mouth daily after supper. *Take 30 minutes after evening meal*    traZODone (DESYREL) 100 MG tablet Take 100 mg by mouth at bedtime.        If you experience worsening of your admission symptoms, develop shortness of breath, life threatening emergency, suicidal or homicidal thoughts you must seek medical attention immediately by calling 911 or calling your MD immediately  if symptoms less severe.  You Must read complete instructions/literature along with all the possible adverse reactions/side effects for all the Medicines you take and that have been  prescribed to you. Take any new Medicines after you have completely understood and accept all the possible adverse reactions/side effects.   Please note  You were cared for by a hospitalist during your hospital stay. If you have any questions about your discharge medications or the care you received while you were in the hospital after you are discharged, you can call the unit and asked to speak with the hospitalist on call if the hospitalist that took care of you is not available. Once you are discharged, your primary care  physician will handle any further medical issues. Please note that NO REFILLS for any discharge medications will be authorized once you are discharged, as it is imperative that you return to your primary care physician (or establish a relationship with a primary care physician if you do not have one) for your aftercare needs so that they can reassess your need for medications and monitor your lab values. Today   SUBJECTIVE   Doing well  VITAL SIGNS:  Blood pressure 125/60, pulse 66, temperature 97.9 F (36.6 C), temperature source Oral, resp. rate 18, height 5\' 8"  (1.727 m), weight 83 kg (183 lb), SpO2 97 %.  I/O:   Intake/Output Summary (Last 24 hours) at 03/11/16 0926 Last data filed at 03/11/16 0500  Gross per 24 hour  Intake              240 ml  Output             1075 ml  Net             -835 ml    PHYSICAL EXAMINATION:  GENERAL:  75 y.o.-year-old patient lying in the bed with no acute distress.  EYES: Pupils equal, round, reactive to light and accommodation. No scleral icterus. Extraocular muscles intact.  HEENT: Head atraumatic, normocephalic. Oropharynx and nasopharynx clear.  NECK:  Supple, no jugular venous distention. No thyroid enlargement, no tenderness.  LUNGS: Normal breath sounds bilaterally, no wheezing, rales,rhonchi or crepitation. No use of accessory muscles of respiration.  CARDIOVASCULAR: S1, S2 normal. No murmurs, rubs, or gallops.  ABDOMEN: Soft, non-tender, non-distended. Bowel sounds present. No organomegaly or mass.  EXTREMITIES: No pedal edema, cyanosis, or clubbing.  NEUROLOGIC: Cranial nerves II through XII are intact. Muscle strength 5/5 in all extremities. Sensation intact. Gait not checked.  PSYCHIATRIC: The patient is alert and oriented x 3.  SKIN: No obvious rash, lesion, or ulcer.   DATA REVIEW:   CBC   Recent Labs Lab 03/10/16 0501  WBC 6.7  HGB 11.3*  HCT 33.0*  PLT 224    Chemistries   Recent Labs Lab 03/09/16 1744   03/10/16 0501  NA 138  < > 143  K 4.2  < > 4.0  CL 108  < > 115*  CO2 18*  < > 22  GLUCOSE 133*  < > 98  BUN 20  < > 20  CREATININE 2.59*  < > 2.31*  CALCIUM 9.2  < > 8.4*  MG 2.0  --   --   AST 31  --   --   ALT 15*  --   --   ALKPHOS 68  --   --   BILITOT 0.4  --   --   < > = values in this interval not displayed.  Microbiology Results   No results found for this or any previous visit (from the past 240 hour(s)).  RADIOLOGY:  Dg Chest 2 View  Result Date: 03/09/2016 CLINICAL  DATA:  75 year old male with sudden onset weakness, right lower extremity pain after lifting a heavy objects. Lightheadedness and diaphoresis. Initial encounter. EXAM: CHEST  2 VIEW COMPARISON:  CT Abdomen and Pelvis 07/25/2013. FINDINGS: Seated AP and lateral views of the chest. Stable moderate elevation of the right hemidiaphragm. Small retrocardiac previously only fat containing hiatal hernia might have increased. Other mediastinal contours are normal. Visualized tracheal air column is within normal limits. No pneumothorax, pulmonary edema, pleural effusion or confluent pulmonary opacity. No pneumoperitoneum. Negative visible bowel gas pattern. No acute osseous abnormality identified. IMPRESSION: No acute cardiopulmonary abnormality. Electronically Signed   By: Genevie Ann M.D.   On: 03/09/2016 18:26   US Renal  Result Date: 03/09/2016 CLINICAL DATA:  75 y/o M; single kidney with acute kidney injury concerning for obstruction. EXAM: RENAL / URINARY TRACT ULTRASOUND COMPLETE COMPARISON:  07/25/2013 CT of abdomen and pelvis. FINDINGS: Right Kidney: Length: 11.1 cm. Echogenicity within normal limits. No mass or hydronephrosis visualized. Left Kidney: Resected. Bladder: Appears normal for degree of bladder distention. Right ureteral jet noted. IMPRESSION: No hydronephrosis. Status post left nephrectomy. Otherwise unremarkable renal ultrasound. Electronically Signed   By: Kristine Garbe M.D.   On: 03/09/2016  22:11   US Carotid Bilateral  Result Date: 03/10/2016 CLINICAL DATA:  Syncope. EXAM: BILATERAL CAROTID DUPLEX ULTRASOUND TECHNIQUE: Pearline Cables scale imaging, color Doppler and duplex ultrasound were performed of bilateral carotid and vertebral arteries in the neck. COMPARISON:  None. FINDINGS: Criteria: Quantification of carotid stenosis is based on velocity parameters that correlate the residual internal carotid diameter with NASCET-based stenosis levels, using the diameter of the distal internal carotid lumen as the denominator for stenosis measurement. The following velocity measurements were obtained: RIGHT ICA:  155 cm/sec CCA:  [id number] cm/sec SYSTOLIC ICA/CCA RATIO:  1.3 DIASTOLIC ICA/CCA RATIO:  1.2 ECA:  142 cm/sec LEFT ICA:  110 cm/sec CCA:  [id number] cm/sec SYSTOLIC ICA/CCA RATIO:  0.9 DIASTOLIC ICA/CCA RATIO:  1.0 ECA:  104 cm/sec RIGHT CAROTID ARTERY: Echogenic and heterogeneous plaque at the right carotid bulb. Echogenic plaque in the proximal internal carotid artery. There appears to be close to 50% narrowing of the mid right internal carotid artery due to calcified plaque based on the color Doppler images. The peak systolic velocity in this area is also mildly elevated measuring up to 155 cm/sec. RIGHT VERTEBRAL ARTERY: Antegrade flow and normal waveform in the right vertebral artery. LEFT CAROTID ARTERY: Intimal thickening at the left carotid bulb. Small amount of echogenic plaque in the left internal carotid artery. Normal waveforms and velocities in left internal carotid artery. LEFT VERTEBRAL ARTERY: Antegrade flow and normal waveform in the left vertebral artery. 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 Peak systolic velocity in the right internal carotid artery is slightly elevated but the right carotid artery ratios are less than 2. Suspect that the degree of stenosis in the right internal carotid artery is close to 50%. Estimated degree of  stenosis in the left internal carotid artery is less than 50%. Patent vertebral arteries. Electronically Signed   By: Markus Daft M.D.   On: 03/10/2016 12:02     Management plans discussed with the patient, family and they are in agreement.  CODE STATUS:     Code Status Orders        Start     Ordered   03/09/16 2230  Full code  Continuous     03/09/16 2229    Code Status History  Date Active Date Inactive Code Status Order ID Comments User Context   This patient has a current code status but no historical code status.      TOTAL TIME TAKING CARE OF THIS PATIENT: 40 minutes.    Kentarius Partington M.D on 03/11/2016 at 9:26 AM  Between 7am to 6pm - Pager - [phone] After 6pm go to [email or web address] - password [email or web address]  Office  [phone]  CC: Primary care physician; Pcp Not In System

## 2016-03-11 NOTE — Progress Notes (Signed)
Per PT pt is cleared to return to home without any need for PT services. Dr. Posey Pronto notified.

## 2016-04-17 ENCOUNTER — Emergency Department: Payer: Medicare Other

## 2016-04-17 ENCOUNTER — Emergency Department
Admission: EM | Admit: 2016-04-17 | Discharge: 2016-04-17 | Disposition: A | Discharge status: Home / Self Care | Payer: Medicare Other | Attending: Emergency Medicine | Admitting: Emergency Medicine

## 2016-04-17 ENCOUNTER — Encounter: Payer: Self-pay | Admitting: Emergency Medicine

## 2016-04-17 DIAGNOSIS — I10 Essential (primary) hypertension: Secondary | ICD-10-CM | POA: Diagnosis not present

## 2016-04-17 DIAGNOSIS — Z7982 Long term (current) use of aspirin: Secondary | ICD-10-CM | POA: Insufficient documentation

## 2016-04-17 DIAGNOSIS — Z79899 Other long term (current) drug therapy: Secondary | ICD-10-CM | POA: Insufficient documentation

## 2016-04-17 DIAGNOSIS — Z8546 Personal history of malignant neoplasm of prostate: Secondary | ICD-10-CM | POA: Diagnosis not present

## 2016-04-17 DIAGNOSIS — R55 Syncope and collapse: Secondary | ICD-10-CM | POA: Insufficient documentation

## 2016-04-17 LAB — CBC WITH DIFFERENTIAL/PLATELET
Basophils Absolute: 0.1 10*3/uL (ref 0–0.1)
Basophils Relative: 1 %
EOS PCT: 1 %
Eosinophils Absolute: 0.1 10*3/uL (ref 0–0.7)
HCT: 35.6 % — ABNORMAL LOW (ref 40.0–52.0)
Hemoglobin: 12 g/dL — ABNORMAL LOW (ref 13.0–18.0)
LYMPHS ABS: 2 10*3/uL (ref 1.0–3.6)
LYMPHS PCT: 25 %
MCH: 32.1 pg (ref 26.0–34.0)
MCHC: 33.7 g/dL (ref 32.0–36.0)
MCV: 95.3 fL (ref 80.0–100.0)
MONO ABS: 0.6 10*3/uL (ref 0.2–1.0)
Monocytes Relative: 8 %
Neutro Abs: 5.3 10*3/uL (ref 1.4–6.5)
Neutrophils Relative %: 65 %
PLATELETS: 210 10*3/uL (ref 150–440)
RBC: 3.74 MIL/uL — ABNORMAL LOW (ref 4.40–5.90)
RDW: 15 % — AB (ref 11.5–14.5)
WBC: 8 10*3/uL (ref 3.8–10.6)

## 2016-04-17 LAB — COMPREHENSIVE METABOLIC PANEL
ALBUMIN: 3.5 g/dL (ref 3.5–5.0)
ALT: 10 U/L — AB (ref 17–63)
AST: 20 U/L (ref 15–41)
Alkaline Phosphatase: 60 U/L (ref 38–126)
Anion gap: 10 (ref 5–15)
BILIRUBIN TOTAL: 0.6 mg/dL (ref 0.3–1.2)
BUN: 18 mg/dL (ref 6–20)
CHLORIDE: 113 mmol/L — AB (ref 101–111)
CO2: 17 mmol/L — ABNORMAL LOW (ref 22–32)
CREATININE: 2.39 mg/dL — AB (ref 0.61–1.24)
Calcium: 8.5 mg/dL — ABNORMAL LOW (ref 8.9–10.3)
GFR calc Af Amer: 29 mL/min — ABNORMAL LOW (ref >60)
GFR calc non Af Amer: 25 mL/min — ABNORMAL LOW (ref >60)
GLUCOSE: 94 mg/dL (ref 65–99)
POTASSIUM: 3.5 mmol/L (ref 3.5–5.1)
Sodium: 140 mmol/L (ref 135–145)
Total Protein: 6.6 g/dL (ref 6.5–8.1)

## 2016-04-17 LAB — TROPONIN I
Troponin I: 0.04 ng/mL (ref <0.03)
Troponin I: 0.04 ng/mL (ref <0.03)

## 2016-04-17 NOTE — ED Provider Notes (Addendum)
[facility] Emergency Department Provider Note   ____________________________________________   First MD Initiated Contact with Patient 04/17/16 1407     (approximate)  I have reviewed the triage vital signs and the nursing notes.   HISTORY  Chief Complaint Seizures    HPI [name] is a 75 y.o. male patient comes in by EMS who reports that his wife said he passed out twice today like he did last time. Apparently he slid out of his chair onto the floor. The was no history that I can elicit twitching or anything else. Old records indicate he was hospitalized last time for syncope.   Past Medical History:  Diagnosis Date  . Acute kidney injury ([place])    a. 07/2013 in setting of C diff;  b. 02/2016.  . C. difficile colitis    a. 07/2013.  Marland Kitchen EtOH dependence (HCC)    a. 3 cans of beer/day.  . Hyperlipemia   . Hypertension   . Phimosis    a. 09/2014 s/p circumcision.  . Prostate cancer ([place])    a. 2015 s/p radiation.  . RBBB   . Seizures ([place])   . Solitary kidney    [name].    Patient Active Problem List   Diagnosis Date Noted  . Hyperlipemia   . Hypertension   . Hypotension   . Metabolic acidosis, normal anion gap (NAG)   . Near syncope   . H/O unilateral nephrectomy   . Bilateral carotid artery stenosis   . AKI (acute kidney injury) ([place]) 03/09/2016    Past Surgical History:  Procedure Laterality Date  . Abdominal Surgery & Left Nephrectomy     a. 1968 removal of shrapnel and L nephrectomy.  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 Left leg  Prior to Admission medications   Medication Sig Start Date End Date Taking? Authorizing Provider  aspirin 81 MG chewable tablet Chew 81 mg by mouth every morning.    Historical Provider, MD  FLUoxetine (PROZAC) 20 MG capsule Take 40 mg by mouth every morning.    Historical Provider, MD  HYDROcodone-acetaminophen (NORCO/VICODIN) 5-325 MG tablet Take 1 tablet by mouth every 8 (eight)  hours as needed for moderate pain. 03/11/16   Fritzi Mandes, MD  pantoprazole (PROTONIX) 40 MG tablet Take 40 mg by mouth every morning.    Historical Provider, MD  tamsulosin (FLOMAX) 0.4 MG CAPS capsule Take 0.4 mg by mouth daily after supper. *Take 30 minutes after evening meal*    Historical Provider, MD  traZODone (DESYREL) 100 MG tablet Take 100 mg by mouth at bedtime.    Historical Provider, MD    Allergies Patient has no known allergies.  No family history on file.  Social History Social History  Substance Use Topics  . Smoking status: Never Smoker  . Smokeless tobacco: Never Used  . Alcohol use 12.6 oz/week    21 Cans of beer per week     Comment: 3 12oz cans a day    Review of Systems Constitutional: No fever/chills Eyes: No visual changes. ENT: No sore throat. Cardiovascular: Denies chest pain. Respiratory: Denies shortness of breath. Gastrointestinal: No abdominal pain.  No nausea, no vomiting.  No diarrhea.  No constipation. Genitourinary: Negative for dysuria. Musculoskeletal: Negative for back pain. Skin: Negative for rash. Neurological: Negative for headaches,New focal weakness or numbness.  10-point ROS otherwise negative.  ____________________________________________   PHYSICAL EXAM:  VITAL SIGNS: ED Triage Vitals  Enc Vitals Group  BP      Pulse      Resp      Temp      Temp src      SpO2      Weight      Height      Head Circumference      Peak Flow      Pain Score      Pain Loc      Pain Edu?      Excl. in [place]?     Constitutional: Alert and oriented. Well appearing and in no acute distress. Eyes: Conjunctivae are normal. PERRL. EOMI. Head: Atraumatic. Nose: No congestion/rhinnorhea. Mouth/Throat: Mucous membranes are moist.  Oropharynx non-erythematous. Neck: No stridor.   Cardiovascular: Normal rate, regular rhythm. Grossly normal heart sounds.  Good peripheral circulation. Respiratory: Normal respiratory effort.  No retractions.  Lungs CTAB. Gastrointestinal: Soft and nontender. No distention. No abdominal bruits. No CVA tenderness. Musculoskeletal: No new findings Neurologic:  Normal speech and language. No new gross focal neurologic deficits are appreciated.  Skin:  Skin is warm, dry and intact. No rash noted. Psychiatric: Mood and affect are normal. Speech and behavior are normal.  ____________________________________________   LABS (all labs ordered are listed, but only abnormal results are displayed)  Labs Reviewed  COMPREHENSIVE METABOLIC PANEL  CBC WITH DIFFERENTIAL/PLATELET   ____________________________________________  EKG  EKG read and interpreted by me shows normal sinus rhythm rate of 92 left axis right bundle branch block no obvious acute changes. Similar to EKG from 03/09/2016 ____________________________________________  RADIOLOGY  ____________________________________________   PROCEDURES  Procedure(s) performed:   Procedures  Critical Care performed:  ____________________________________________   INITIAL IMPRESSION / ASSESSMENT AND PLAN / ED COURSE  Pertinent labs & imaging results that were available during my care of the patient were reviewed by me and considered in my medical decision making (see chart for details).    Clinical Course    Ekg looks similar to prior from last month. I will repeat the troponin and sign the patient out to Dr. Joni Fears.  ____________________________________________   FINAL CLINICAL IMPRESSION(S) / ED DIAGNOSES  Final diagnoses:  None      NEW MEDICATIONS STARTED DURING THIS VISIT:  New Prescriptions   No medications on file     Note:  This document was prepared using Dragon voice recognition software and may include unintentional dictation errors.    Nena Polio, MD 04/17/16 1552    Nena Polio, MD 04/17/16 [phone]

## 2016-04-17 NOTE — ED Triage Notes (Signed)
Pt to ED via EMS from home c/o seizure today x2.  EMS states wife witnessed patient slide out of his chair to the floor.  Pt had prior seizure about 1 month ago and treated here and 35 years before that with last seizure.  Pt presents A&Ox4, speaking in complete and coherent sentences, chest rise even and unlabored.

## 2016-04-17 NOTE — ED Provider Notes (Signed)
Assume care of patient from Dr. Rip Harbour waiting for second troponin. Repeat troponin is identical at 0.04. Given his symptoms and the lack of interval change in the troponin, the patient appears to be stable. This is not consistent with ACS PE or dissection. The patient is not septic and has normal vital signs. Discharge home to follow up with cardiology for further evaluation.   Carrie Mew, MD 04/17/16 [phone]

## 2016-05-08 NOTE — Progress Notes (Signed)
   03/11/16 0919  Acute Rehab PT Goals  Patient Stated Goal to go home  PT Goal Formulation With patient  Time For Goal Achievement 03/25/16  Potential to Achieve Goals Good  PT Time Calculation  PT Start Time (ACUTE ONLY) 0843  PT Stop Time (ACUTE ONLY) 0858  PT Time Calculation (min) (ACUTE ONLY) 15 min  PT G-Codes **NOT FOR INPATIENT CLASS**  Functional Assessment Tool Used clinical judgement  Functional Limitation Mobility: Walking and moving around  Mobility: Walking and Moving Around Current Status [id number]) CI  Mobility: Walking and Moving Around Goal Status [id number]) CI  Mobility: Walking and Moving Around Discharge Status [id number]) CI  PT General Charges  $$ ACUTE PT VISIT 1 Procedure  PT Evaluation  $PT Eval Low Complexity 1 Procedure    Late-entry G-codes entered after review of initial documentation by Leitha Bleak, [place]. Owens Shark, PT, DPT, NCS 05/08/16, 2:53 PM [phone]

## 2018-02-13 ENCOUNTER — Ambulatory Visit
Admission: EM | Admit: 2018-02-13 | Discharge: 2018-02-13 | Disposition: A | Discharge status: Home / Self Care | Payer: TRICARE For Life (TFL) | Attending: Family Medicine | Admitting: Family Medicine

## 2018-02-13 ENCOUNTER — Other Ambulatory Visit: Payer: Self-pay

## 2018-02-13 DIAGNOSIS — Z23 Encounter for immunization: Secondary | ICD-10-CM

## 2018-02-13 DIAGNOSIS — W268XXA Contact with other sharp object(s), not elsewhere classified, initial encounter: Secondary | ICD-10-CM | POA: Diagnosis not present

## 2018-02-13 DIAGNOSIS — S61412A Laceration without foreign body of left hand, initial encounter: Secondary | ICD-10-CM | POA: Diagnosis not present

## 2018-02-13 MED ORDER — TETANUS-DIPHTH-ACELL PERTUSSIS 5-2.5-18.5 LF-MCG/0.5 IM SUSP
0.5000 mL | Freq: Once | INTRAMUSCULAR | Status: AC
  Administered 2018-02-13: 0.5 mL via INTRAMUSCULAR

## 2018-02-13 NOTE — ED Triage Notes (Signed)
Pt reports he cut his hand on a jagged piece of metal on a barrel. Left palmar laceration. Pain 3/10. Deep scratch noted with one small open area.

## 2018-02-13 NOTE — ED Provider Notes (Signed)
[facility]    CSN: [id number] Arrival date & time: 02/13/18  1834     History   Chief Complaint Chief Complaint  Patient presents with  . Extremity Laceration    HPI [name] is a 77 y.o. male.   77 yo male with a c/o laceration to his left palm after cutting the skin on a jagged piece of metal about one hour ago. Patient needs tetanus vaccine.  The history is provided by the patient.    Past Medical History:  Diagnosis Date  . Acute kidney injury ([place])    a. 07/2013 in setting of C diff;  b. 02/2016.  . C. difficile colitis    a. 07/2013.  Marland Kitchen EtOH dependence (HCC)    a. 3 cans of beer/day.  . Hyperlipemia   . Hypertension   . Phimosis    a. 09/2014 s/p circumcision.  . Prostate cancer ([place])    a. 2015 s/p radiation.  . RBBB   . Seizures ([place])   . Solitary kidney    [name].    Patient Active Problem List   Diagnosis Date Noted  . Hyperlipemia   . Hypertension   . Hypotension   . Metabolic acidosis, normal anion gap (NAG)   . Near syncope   . H/O unilateral nephrectomy   . Bilateral carotid artery stenosis   . AKI (acute kidney injury) ([place]) 03/09/2016    Past Surgical History:  Procedure Laterality Date  . Abdominal Surgery & Left Nephrectomy     a. 1968 removal of shrapnel and L nephrectomy.       Home Medications    Prior to Admission medications   Medication Sig Start Date End Date Taking? Authorizing Provider  aspirin EC 81 MG tablet Take 81 mg by mouth daily.    [provider]  FLUoxetine (PROZAC) 20 MG capsule Take 40 mg by mouth every morning.    [provider]  pantoprazole (PROTONIX) 40 MG tablet Take 40 mg by mouth every morning.    [provider]  tamsulosin (FLOMAX) 0.4 MG CAPS capsule Take 0.4 mg by mouth daily after supper. *Take 30 minutes after evening meal*    [provider]  traZODone (DESYREL) 100 MG tablet Take 200 mg by mouth at bedtime.     [provider]    Family History History reviewed. No pertinent family history.  Social History Social History   Tobacco Use  . Smoking status: Never Smoker  . Smokeless tobacco: Never Used  Substance Use Topics  . Alcohol use: Yes    Alcohol/week: 21.0 standard drinks    Types: 21 Cans of beer per week    Comment: 3 12oz cans a day  . Drug use: No     Allergies   Patient has no known allergies.   Review of Systems Review of Systems   Physical Exam Triage Vital Signs ED Triage Vitals  Enc Vitals Group     BP 02/13/18 1853 (!) 145/77     Pulse Rate 02/13/18 1853 71     Resp 02/13/18 1853 16     Temp 02/13/18 1853 98.4 F (36.9 C)     Temp Source 02/13/18 1853 Oral     SpO2 02/13/18 1853 98 %     Weight 02/13/18 1855 168 lb (76.2 kg)     Height 02/13/18 1855 5\' 7"  (1.702 m)     Head Circumference --      Peak  Flow --      Pain Score 02/13/18 1855 3     Pain Loc --      Pain Edu? --      Excl. in [place]? --    No data found.  Updated Vital Signs BP (!) 145/77 (BP Location: Right Arm)   Pulse 71   Temp 98.4 F (36.9 C) (Oral)   Resp 16   Ht 5\' 7"  (1.702 m)   Wt 76.2 kg   SpO2 98%   BMI 26.31 kg/m   Visual Acuity Right Eye Distance:   Left Eye Distance:   Bilateral Distance:    Right Eye Near:   Left Eye Near:    Bilateral Near:     Physical Exam  Constitutional: He appears well-developed and well-nourished. No distress.  Musculoskeletal:       Left hand: He exhibits laceration (2 separate superficial lacerations on palm (3cm) each). He exhibits normal range of motion, no bony tenderness, normal two-point discrimination, normal capillary refill, no deformity and no swelling. Normal sensation noted. Normal strength noted.  Skin: He is not diaphoretic.  Nursing note and vitals reviewed.    UC Treatments / Results  Labs (all labs ordered are listed, but only abnormal results are displayed) Labs Reviewed - No data to  display  EKG None  Radiology No results found.  Procedures Laceration Repair Date/Time: 02/13/2018 8:39 PM Performed by: Norval Gable, MD Authorized by: Norval Gable, MD   Consent:    Consent obtained:  Verbal   Consent given by:  Patient   Risks discussed:  Infection, need for additional repair, poor cosmetic result, poor wound healing and retained foreign body   Alternatives discussed:  No treatment Anesthesia (see MAR for exact dosages):    Anesthesia method:  None Laceration details:    Location:  Hand   Hand location:  L palm (2 separate (3 cm each))   Length (cm):  3 Repair type:    Repair type:  Simple Pre-procedure details:    Preparation:  Patient was prepped and draped in usual sterile fashion Exploration:    Hemostasis achieved with:  Direct pressure   Wound exploration: wound explored through full range of motion and entire depth of wound probed and visualized     Wound extent comment:  Superficial    Contaminated: no   Treatment:    Area cleansed with:  Hibiclens   Amount of cleaning:  Standard   Irrigation solution:  Sterile saline   Foreign body removal: no foreign material visualized.   Skin repair:    Repair method:  Steri-Strips and tissue adhesive   Number of Steri-Strips:  5 Approximation:    Approximation:  Close Post-procedure details:    Dressing:  Open (no dressing)   Patient tolerance of procedure:  Tolerated well, no immediate complications   (including critical care time)  Medications Ordered in UC Medications  Tdap (BOOSTRIX) injection 0.5 mL (0.5 mLs Intramuscular Given 02/13/18 1900)    Initial Impression / Assessment and Plan / UC Course  I have reviewed the triage vital signs and the nursing notes.  Pertinent labs & imaging results that were available during my care of the patient were reviewed by me and considered in my medical decision making (see chart for details).      Final Clinical Impressions(s) / UC Diagnoses    Final diagnoses:  Laceration of left palm, initial encounter     Discharge Instructions     Watch for redness, swelling,  pain, drainage, fevers and follow up if present    ED Prescriptions    None     1. diagnosis reviewed with patient 2. Tetanus vaccine given 3. Procedure as above 3. Recommend supportive treatment with routine wound care 4. Follow-up prn if symptoms worsen or don't improve  Controlled Substance Prescriptions [place] Controlled Substance Registry consulted? Not Applicable   Norval Gable, MD 02/17/18 [phone]

## 2018-02-13 NOTE — ED Notes (Signed)
Wound cleansed with normal saline and hibiclens

## 2018-02-13 NOTE — Discharge Instructions (Signed)
Watch for redness, swelling, pain, drainage, fevers and follow up if present

## 2018-03-17 ENCOUNTER — Other Ambulatory Visit: Payer: Self-pay

## 2018-03-17 ENCOUNTER — Encounter: Payer: Self-pay | Admitting: Emergency Medicine

## 2018-03-17 ENCOUNTER — Emergency Department
Admission: EM | Admit: 2018-03-17 | Discharge: 2018-03-17 | Disposition: A | Discharge status: Home / Self Care | Payer: Medicare Other | Attending: Emergency Medicine | Admitting: Emergency Medicine

## 2018-03-17 DIAGNOSIS — F1020 Alcohol dependence, uncomplicated: Secondary | ICD-10-CM

## 2018-03-17 DIAGNOSIS — Z8546 Personal history of malignant neoplasm of prostate: Secondary | ICD-10-CM | POA: Diagnosis not present

## 2018-03-17 DIAGNOSIS — I10 Essential (primary) hypertension: Secondary | ICD-10-CM | POA: Diagnosis not present

## 2018-03-17 DIAGNOSIS — Y901 Blood alcohol level of 20-39 mg/100 ml: Secondary | ICD-10-CM | POA: Diagnosis not present

## 2018-03-17 DIAGNOSIS — R569 Unspecified convulsions: Secondary | ICD-10-CM | POA: Diagnosis present

## 2018-03-17 LAB — COMPREHENSIVE METABOLIC PANEL
ALK PHOS: 61 U/L (ref 38–126)
ALT: 15 U/L (ref 0–44)
ANION GAP: 11 (ref 5–15)
AST: 22 U/L (ref 15–41)
Albumin: 3.8 g/dL (ref 3.5–5.0)
BUN: 16 mg/dL (ref 8–23)
CALCIUM: 8.7 mg/dL — AB (ref 8.9–10.3)
CO2: 23 mmol/L (ref 22–32)
Chloride: 99 mmol/L (ref 98–111)
Creatinine, Ser: 1.94 mg/dL — ABNORMAL HIGH (ref 0.61–1.24)
GFR calc non Af Amer: 32 mL/min — ABNORMAL LOW (ref >60)
GFR, EST AFRICAN AMERICAN: 37 mL/min — AB (ref >60)
GLUCOSE: 96 mg/dL (ref 70–99)
POTASSIUM: 3.8 mmol/L (ref 3.5–5.1)
Sodium: 133 mmol/L — ABNORMAL LOW (ref 135–145)
Total Bilirubin: 0.4 mg/dL (ref 0.3–1.2)
Total Protein: 7.4 g/dL (ref 6.5–8.1)

## 2018-03-17 LAB — CBC WITH DIFFERENTIAL/PLATELET
ABS IMMATURE GRANULOCYTES: 0.05 10*3/uL (ref 0.00–0.07)
Basophils Absolute: 0 10*3/uL (ref 0.0–0.1)
Basophils Relative: 1 %
EOS ABS: 0.1 10*3/uL (ref 0.0–0.5)
Eosinophils Relative: 2 %
HEMATOCRIT: 40.2 % (ref 39.0–52.0)
Hemoglobin: 13.4 g/dL (ref 13.0–17.0)
Immature Granulocytes: 1 %
LYMPHS ABS: 1.6 10*3/uL (ref 0.7–4.0)
Lymphocytes Relative: 24 %
MCH: 31 pg (ref 26.0–34.0)
MCHC: 33.3 g/dL (ref 30.0–36.0)
MCV: 93.1 fL (ref 80.0–100.0)
MONO ABS: 0.7 10*3/uL (ref 0.1–1.0)
MONOS PCT: 11 %
Neutro Abs: 4 10*3/uL (ref 1.7–7.7)
Neutrophils Relative %: 61 %
PLATELETS: 217 10*3/uL (ref 150–400)
RBC: 4.32 MIL/uL (ref 4.22–5.81)
RDW: 14 % (ref 11.5–15.5)
WBC: 6.4 10*3/uL (ref 4.0–10.5)
nRBC: 0 % (ref 0.0–0.2)

## 2018-03-17 LAB — ETHANOL: Alcohol, Ethyl (B): 25 mg/dL — ABNORMAL HIGH (ref <10)

## 2018-03-17 LAB — TROPONIN I: Troponin I: <0.03 ng/mL (ref <0.03)

## 2018-03-17 LAB — CK: CK TOTAL: 249 U/L (ref 49–397)

## 2018-03-17 MED ORDER — CHLORDIAZEPOXIDE HCL 25 MG PO CAPS
25.0000 mg | ORAL_CAPSULE | Freq: Once | ORAL | Status: AC
  Administered 2018-03-17: 25 mg via ORAL
  Filled 2018-03-17: qty 1

## 2018-03-17 MED ORDER — LEVETIRACETAM 500 MG PO TABS: 500.0000 mg | ORAL_TABLET | Freq: Two times a day (BID) | ORAL | 2 refills | Status: DC

## 2018-03-17 MED ORDER — CHLORDIAZEPOXIDE HCL 25 MG PO CAPS: ORAL_CAPSULE | ORAL | 0 refills | Status: DC

## 2018-03-17 MED ORDER — LEVETIRACETAM 500 MG PO TABS
500.0000 mg | ORAL_TABLET | Freq: Once | ORAL | Status: AC
  Administered 2018-03-17: 500 mg via ORAL
  Filled 2018-03-17: qty 1

## 2018-03-17 NOTE — ED Notes (Signed)
Peripheral IV discontinued. Catheter intact. No signs of infiltration or redness. Gauze applied to IV site.    Discharge instructions reviewed with patient. Questions fielded by this RN. Patient verbalizes understanding of instructions. Patient discharged home in stable condition per williams. No acute distress noted at time of discharge.

## 2018-03-17 NOTE — ED Notes (Signed)
Led in low locked position, seizure pads on siderails. Call bell at right side.

## 2018-03-17 NOTE — ED Provider Notes (Addendum)
[facility] Emergency Department Provider Note       Time seen: ----------------------------------------- 7:35 PM on 03/17/2018 -----------------------------------------   I have reviewed the triage vital signs and the nursing notes.  HISTORY   Chief Complaint Seizures    HPI [name] is a 77 y.o. male with a history of acute kidney injury, C. difficile colitis, alcohol dependence, hyperlipidemia, hypertension, seizures who presents to the ED for a seizure.  Patient notes last seizure was 5 months ago according to EMS.  Wife states that he began to have leg shaking and his eyes rolled back in his head.  He was not incontinent of bowel or bladder function, he did not bite his tongue.  He arrives alert and oriented with no current medications for seizures.  He does drink alcohol daily.  Past Medical History:  Diagnosis Date  . Acute kidney injury ([place])    a. 07/2013 in setting of C diff;  b. 02/2016.  . C. difficile colitis    a. 07/2013.  Marland Kitchen EtOH dependence (H[facility])    a. 3 cans of beer/day.  . Hyperlipemia   . Hypertension   . Phimosis    a. 09/2014 s/p circumcision.  . Prostate cancer ([place])    a. 2015 s/p radiation.  . RBBB   . Seizures ([place])   . Solitary kidney    [name].    Patient Active Problem List   Diagnosis Date Noted  . Hyperlipemia   . Hypertension   . Hypotension   . Metabolic acidosis, normal anion gap (NAG)   . Near syncope   . H/O unilateral nephrectomy   . Bilateral carotid artery stenosis   . AKI (acute kidney injury) ([place]) 03/09/2016    Past Surgical History:  Procedure Laterality Date  . Abdominal Surgery & Left Nephrectomy     a. 1968 removal of shrapnel and L nephrectomy.    Allergies Patient has no known allergies.  Social History Social History   Tobacco Use  . Smoking status: Never Smoker  . Smokeless tobacco: Never Used  Substance Use Topics  . Alcohol use: Yes    Alcohol/week: 21.0  standard drinks    Types: 21 Cans of beer per week    Comment: 3 12oz cans a day  . Drug use: No   Review of Systems Constitutional: Negative for fever. Cardiovascular: Negative for chest pain. Respiratory: Negative for shortness of breath. Gastrointestinal: Negative for abdominal pain, vomiting and diarrhea. Musculoskeletal: Negative for back pain. Skin: Negative for rash. Neurological: Negative for headaches, focal weakness or numbness.  All systems negative/normal/unremarkable except as stated in the HPI  ____________________________________________   PHYSICAL EXAM:  VITAL SIGNS: ED Triage Vitals  Enc Vitals Group     BP 03/17/18 1911 128/70     Pulse Rate 03/17/18 1911 67     Resp 03/17/18 1911 14     Temp 03/17/18 1911 98.5 F (36.9 C)     Temp Source 03/17/18 1911 Oral     SpO2 03/17/18 1911 97 %     Weight 03/17/18 1912 168 lb (76.2 kg)     Height 03/17/18 1912 5\' 8"  (0.981 m)     Head Circumference --      Peak Flow --      Pain Score 03/17/18 1912 0     Pain Loc --      Pain Edu? --      Excl. in [place]? --    Constitutional: Alert  and oriented. Well appearing and in no distress. Eyes: Conjunctivae are normal. Normal extraocular movements. ENT   Head: Normocephalic and atraumatic.   Nose: No congestion/rhinnorhea.   Mouth/Throat: Mucous membranes are moist.   Neck: No stridor. Cardiovascular: Normal rate, regular rhythm. No murmurs, rubs, or gallops. Respiratory: Normal respiratory effort without tachypnea nor retractions. Breath sounds are clear and equal bilaterally. No wheezes/rales/rhonchi. Gastrointestinal: Soft and nontender. Normal bowel sounds Musculoskeletal: Nontender with normal range of motion in extremities. No lower extremity tenderness nor edema. Neurologic:  Normal speech and language. No gross focal neurologic deficits are appreciated.  Skin:  Skin is warm, dry and intact. No rash noted. Psychiatric: Mood and affect are normal.  Speech and behavior are normal.  ____________________________________________  EKG: Interpreted by me.  Sinus rhythm rate of 67 bpm, right bundle branch block, left axis deviation, normal QT  ____________________________________________  ED COURSE:  As part of my medical decision making, I reviewed the following data within the [place] History obtained from family if available, nursing notes, old chart and ekg, as well as notes from prior ED visits. Patient presented for a seizure, we will assess with labs and imaging as indicated at this time.   Procedures ____________________________________________   LABS (pertinent positives/negatives)  Labs Reviewed  COMPREHENSIVE METABOLIC PANEL - Abnormal; Notable for the following components:      Result Value   Sodium 133 (*)    Creatinine, Ser 1.94 (*)    Calcium 8.7 (*)    GFR calc non Af Amer 32 (*)    GFR calc Af Amer 37 (*)    All other components within normal limits  ETHANOL - Abnormal; Notable for the following components:   Alcohol, Ethyl (B) 25 (*)    All other components within normal limits  CBC WITH DIFFERENTIAL/PLATELET  TROPONIN I  CK  URINALYSIS, COMPLETE (UACMP) WITH MICROSCOPIC   ____________________________________________  DIFFERENTIAL DIAGNOSIS   Seizure, medication noncompliance, alcohol withdrawal, alcohol intoxication  FINAL ASSESSMENT AND PLAN  Seizure, alcohol use disorder   Plan: The patient had presented for a seizure. Patient's labs did not reveal any acute process, however there are still traces of alcohol in his system.  He remains neurologically at his baseline.  I do think the seizures may be alcohol related.  I did give him a dose of Librium here, but it sounds like he may have a chronic seizure history none the less and have started him on Keppra.  I will place him on Librium taper as well as start him on Keppra twice daily.  He is cleared for outpatient  follow-up.  Laurence Aly, MD   Note: This note was generated in part or whole with voice recognition software. Voice recognition is usually quite accurate but there are transcription errors that can and very often do occur. I apologize for any typographical errors that were not detected and corrected.     Earleen Newport, MD 03/17/18 2045    Earleen Newport, MD 03/17/18 2045

## 2018-03-17 NOTE — ED Triage Notes (Signed)
Pt with history of seizures. Last seizure 5 months ago per ems. Per ems pt's wife stated pt's leg began to shake and "his head rolled back". Pt without loss of bowel or bladder, no oral trauma noted. Pt alert and oriented x4. Pt is on no medications for seizures.

## 2018-09-27 ENCOUNTER — Ambulatory Visit (INDEPENDENT_AMBULATORY_CARE_PROVIDER_SITE_OTHER)
Admission: EM | Admit: 2018-09-27 | Discharge: 2018-09-27 | Disposition: A | Discharge status: Home / Self Care | Payer: Medicare Other | Source: Home / Self Care | Attending: Family Medicine | Admitting: Family Medicine

## 2018-09-27 ENCOUNTER — Other Ambulatory Visit: Payer: Self-pay

## 2018-09-27 ENCOUNTER — Encounter: Payer: Self-pay | Admitting: Emergency Medicine

## 2018-09-27 DIAGNOSIS — I959 Hypotension, unspecified: Secondary | ICD-10-CM | POA: Diagnosis not present

## 2018-09-27 DIAGNOSIS — R531 Weakness: Secondary | ICD-10-CM | POA: Diagnosis not present

## 2018-09-27 DIAGNOSIS — R634 Abnormal weight loss: Secondary | ICD-10-CM

## 2018-09-27 NOTE — Discharge Instructions (Addendum)
Go directly to the emergency room as discussed.  °

## 2018-09-27 NOTE — ED Triage Notes (Signed)
Pt c/o weight loss over the past couple of months. He states that last night his wife thought he had a fever. He states he feels weak. He has loss of appetite. He states he is no more than normal pain. When updating social history pt states he cut back on ETOH use back in December.

## 2018-09-27 NOTE — ED Provider Notes (Signed)
[facility] ____________________________________________  Time seen: Approximately 2:42 PM  I have reviewed the triage vital signs and the nursing notes.   HISTORY  Chief Complaint Weight Loss   HPI [name] is a 78 y.o. male past medical history of alcohol dependence, hypertension, hyperlipidemia, prostate cancer, seizures, left nephrectomy, presenting for evaluation of weight loss.  Patient reports for the last 4 months he has had a 20 to 30 pound weight loss without trying.  States over the last few weeks he has been feeling weak and tired accompanying this.  States overall decreased appetite, but does continue to eat and drink.  States he used to also drink a lot more alcohol, states that the end of last year he was drinking 3-4 beers per day, and is currently drinking 1-2 beers per day.  No other alcohol.  Denies drugs.  Denies any acute pain complaints accompanying this.  Denies chest pain, shortness of breath, abdominal pain, dysuria, bloody or dark stools, vomiting, syncope or near syncope.  Does report generalized weakness.  Denies aggravating alleviating factors.  States his family encouraged him to finally come in and prompted his evaluation today.  Further reports he has continued to take his blood pressure medications at home, and normally his blood pressure per patient is in the 160s over 70s.  Today blood pressure 90 systolically and 50D diastolic.  PCP: At [place].    Past Medical History:  Diagnosis Date  . Acute kidney injury ([place])    a. 07/2013 in setting of C diff;  b. 02/2016.  . C. difficile colitis    a. 07/2013.  Marland Kitchen EtOH dependence ([facility])    a. 3 cans of beer/day.  . Hyperlipemia   . Hypertension   . Phimosis    a. 09/2014 s/p circumcision.  . Prostate cancer ([place])    a. 2015 s/p radiation.  . RBBB   . Seizures ([place])   . Solitary kidney    [name].    Patient Active Problem List   Diagnosis Date Noted  . Hyperlipemia   .  Hypertension   . Hypotension   . Metabolic acidosis, normal anion gap (NAG)   . Near syncope   . H/O unilateral nephrectomy   . Bilateral carotid artery stenosis   . AKI (acute kidney injury) ([place]) 03/09/2016    Past Surgical History:  Procedure Laterality Date  . Abdominal Surgery & Left Nephrectomy     a. 1968 removal of shrapnel and L nephrectomy.     No current facility-administered medications for this encounter.   Current Outpatient Medications:  .  aspirin EC 81 MG tablet, Take 81 mg by mouth daily., Disp: , Rfl:  .  FLUoxetine (PROZAC) 20 MG capsule, Take 40 mg by mouth every morning., Disp: , Rfl:  .  levETIRAcetam (KEPPRA) 500 MG tablet, Take 1 tablet (500 mg total) by mouth 2 (two) times daily., Disp: 60 tablet, Rfl: 2 .  pantoprazole (PROTONIX) 40 MG tablet, Take 40 mg by mouth every morning., Disp: , Rfl:  .  tamsulosin (FLOMAX) 0.4 MG CAPS capsule, Take 0.4 mg by mouth daily after supper. *Take 30 minutes after evening meal*, Disp: , Rfl:  .  traZODone (DESYREL) 100 MG tablet, Take 200 mg by mouth at bedtime. , Disp: , Rfl:  .  chlordiazePOXIDE (LIBRIUM) 25 MG capsule, Take 1 tablet 5 times a day on the first day, then decrease by 1 tablet daily until gone, Disp: 15 capsule, Rfl: 0  hctz Lisinopril   Allergies Patient has no known allergies.  History reviewed. No pertinent family history.  Social History Social History   Tobacco Use  . Smoking status: Never Smoker  . Smokeless tobacco: Never Used  Substance Use Topics  . Alcohol use: Yes    Alcohol/week: 21.0 standard drinks    Types: 21 Cans of beer per week    Comment: 3 12oz cans a day-- states he has cut back to 2 12 oz cans daily  . Drug use: No    Review of Systems Constitutional: No fever/chills Eyes: No visual changes. ENT: No sore throat. Cardiovascular: Denies chest pain. Respiratory: Denies shortness of breath. Gastrointestinal: No abdominal pain.  No nausea, no vomiting.  No diarrhea.   No constipation. Genitourinary: Negative for dysuria. Musculoskeletal: Negative for back pain. Skin: Negative for rash. Neurological: Negative for headaches, focal weakness or numbness.  10-point ROS otherwise negative.  ____________________________________________   PHYSICAL EXAM:  VITAL SIGNS: ED Triage Vitals  Enc Vitals Group     BP 09/27/18 1415 (!) 92/47     Pulse Rate 09/27/18 1415 68     Resp 09/27/18 1415 16     Temp 09/27/18 1415 97.8 F (36.6 C)     Temp Source 09/27/18 1415 Oral     SpO2 09/27/18 1415 95 %     Weight 09/27/18 1411 140 lb (63.5 kg)     Height 09/27/18 1411 5\' 8"  (1.727 m)     Head Circumference --      Peak Flow --      Pain Score 09/27/18 1410 0     Pain Loc --      Pain Edu? --      Excl. in [place]? --    Vitals:   09/27/18 1411 09/27/18 1415 09/27/18 1452  BP:  (!) 92/47 (!) 90/56  Pulse:  68   Resp:  16   Temp:  97.8 F (36.6 C)   TempSrc:  Oral   SpO2:  95%   Weight: 140 lb (63.5 kg)    Height: 5\' 8"  (1.727 m)      Constitutional: Alert and oriented. Well appearing and in no acute distress. Eyes: Conjunctivae are normal. ENT      Head: Normocephalic and atraumatic.      Nose: No congestion      Mouth/Throat: Mucous membranes are mois Hematological/Lymphatic/Immunilogical: No cervical lymphadenopathy. Cardiovascular: Normal rate, regular rhythm. Grossly normal heart sounds.  Good peripheral circulation. Respiratory: Normal respiratory effort without tachypnea nor retractions. Breath sounds are clear and equal bilaterally. No wheezes, rales, rhonchi. Gastrointestinal: Soft and nontender.No CVA tenderness. Musculoskeletal: Steady gait. Neurologic:  Normal speech and language. No gross focal neurologic deficits are appreciated. Speech is normal.   Skin:  Skin is warm, dry and intact. No rash noted. Psychiatric: Mood and affect are normal. Speech and behavior are normal. Patient exhibits appropriate insight and judgment    ___________________________________________   LABS (all labs ordered are listed, but only abnormal results are displayed)  Labs Reviewed - No data to display ____________________________________________  PROCEDURES Procedures    INITIAL IMPRESSION / ASSESSMENT AND PLAN / ED COURSE  Pertinent labs & imaging results that were available during my care of the patient were reviewed by me and considered in my medical decision making (see chart for details).  Overall well-appearing patient.  However patient with multiple comorbidities, single kidney and currently hypotensive.  Patient also reporting progressive weight loss without intent with accompanying weakness.  Recommend  further evaluation emergency room at this time as suspect patient is dehydrated, potential acute kidney injury and recommend further evaluation for weight loss.  Patient agrees this plan and states that his son will take him to [facility].  Declined EMS transfer.  Patient stable at time of discharge.   ____________________________________________   FINAL CLINICAL IMPRESSION(S) / ED DIAGNOSES  Final diagnoses:  Weakness  Loss of weight  Hypotension, unspecified hypotension type     ED Discharge Orders    None       Note: This dictation was prepared with Dragon dictation along with smaller phrase technology. Any transcriptional errors that result from this process are unintentional.         Marylene Land, NP 09/27/18 1459

## 2018-09-30 ENCOUNTER — Emergency Department: Payer: Medicare Other

## 2018-09-30 ENCOUNTER — Other Ambulatory Visit: Payer: Self-pay

## 2018-09-30 ENCOUNTER — Inpatient Hospital Stay
Admission: EM | Admit: 2018-09-30 | Discharge: 2018-10-01 | DRG: 690 | Disposition: A | Discharge status: Other Acute Inpatient Hospital | Payer: Medicare Other | Attending: Internal Medicine | Admitting: Internal Medicine

## 2018-09-30 DIAGNOSIS — N183 Chronic kidney disease, stage 3 (moderate): Secondary | ICD-10-CM | POA: Diagnosis present

## 2018-09-30 DIAGNOSIS — F1020 Alcohol dependence, uncomplicated: Secondary | ICD-10-CM | POA: Diagnosis present

## 2018-09-30 DIAGNOSIS — R627 Adult failure to thrive: Secondary | ICD-10-CM | POA: Diagnosis present

## 2018-09-30 DIAGNOSIS — Z923 Personal history of irradiation: Secondary | ICD-10-CM | POA: Diagnosis not present

## 2018-09-30 DIAGNOSIS — Z7289 Other problems related to lifestyle: Secondary | ICD-10-CM

## 2018-09-30 DIAGNOSIS — Z7982 Long term (current) use of aspirin: Secondary | ICD-10-CM

## 2018-09-30 DIAGNOSIS — E860 Dehydration: Secondary | ICD-10-CM | POA: Diagnosis present

## 2018-09-30 DIAGNOSIS — Z8546 Personal history of malignant neoplasm of prostate: Secondary | ICD-10-CM

## 2018-09-30 DIAGNOSIS — F1090 Alcohol use, unspecified, uncomplicated: Secondary | ICD-10-CM

## 2018-09-30 DIAGNOSIS — Z79899 Other long term (current) drug therapy: Secondary | ICD-10-CM | POA: Diagnosis not present

## 2018-09-30 DIAGNOSIS — E785 Hyperlipidemia, unspecified: Secondary | ICD-10-CM | POA: Diagnosis present

## 2018-09-30 DIAGNOSIS — R410 Disorientation, unspecified: Secondary | ICD-10-CM

## 2018-09-30 DIAGNOSIS — N3090 Cystitis, unspecified without hematuria: Principal | ICD-10-CM | POA: Diagnosis present

## 2018-09-30 DIAGNOSIS — I959 Hypotension, unspecified: Secondary | ICD-10-CM

## 2018-09-30 DIAGNOSIS — Z1159 Encounter for screening for other viral diseases: Secondary | ICD-10-CM | POA: Diagnosis not present

## 2018-09-30 DIAGNOSIS — Z905 Acquired absence of kidney: Secondary | ICD-10-CM | POA: Diagnosis not present

## 2018-09-30 DIAGNOSIS — I129 Hypertensive chronic kidney disease with stage 1 through stage 4 chronic kidney disease, or unspecified chronic kidney disease: Secondary | ICD-10-CM | POA: Diagnosis present

## 2018-09-30 DIAGNOSIS — G40909 Epilepsy, unspecified, not intractable, without status epilepticus: Secondary | ICD-10-CM | POA: Diagnosis present

## 2018-09-30 DIAGNOSIS — N179 Acute kidney failure, unspecified: Secondary | ICD-10-CM | POA: Diagnosis present

## 2018-09-30 DIAGNOSIS — I10 Essential (primary) hypertension: Secondary | ICD-10-CM | POA: Diagnosis present

## 2018-09-30 DIAGNOSIS — I4510 Unspecified right bundle-branch block: Secondary | ICD-10-CM | POA: Diagnosis present

## 2018-09-30 DIAGNOSIS — Y92009 Unspecified place in unspecified non-institutional (private) residence as the place of occurrence of the external cause: Secondary | ICD-10-CM

## 2018-09-30 DIAGNOSIS — R296 Repeated falls: Secondary | ICD-10-CM | POA: Diagnosis present

## 2018-09-30 DIAGNOSIS — N189 Chronic kidney disease, unspecified: Secondary | ICD-10-CM

## 2018-09-30 DIAGNOSIS — Z789 Other specified health status: Secondary | ICD-10-CM

## 2018-09-30 DIAGNOSIS — W19XXXA Unspecified fall, initial encounter: Secondary | ICD-10-CM

## 2018-09-30 DIAGNOSIS — R531 Weakness: Secondary | ICD-10-CM | POA: Diagnosis not present

## 2018-09-30 DIAGNOSIS — N390 Urinary tract infection, site not specified: Secondary | ICD-10-CM | POA: Diagnosis present

## 2018-09-30 LAB — URINALYSIS, COMPLETE (UACMP) WITH MICROSCOPIC
Bilirubin Urine: NEGATIVE
Glucose, UA: NEGATIVE mg/dL
Ketones, ur: 5 mg/dL — AB
Nitrite: NEGATIVE
Protein, ur: 30 mg/dL — AB
Specific Gravity, Urine: 1.009 (ref 1.005–1.030)
WBC, UA: >50 WBC/hpf — ABNORMAL HIGH (ref 0–5)
pH: 5 (ref 5.0–8.0)

## 2018-09-30 LAB — SARS CORONAVIRUS 2 BY RT PCR (HOSPITAL ORDER, PERFORMED IN ~~LOC~~ HOSPITAL LAB): SARS Coronavirus 2: NEGATIVE

## 2018-09-30 LAB — URINE DRUG SCREEN, QUALITATIVE (ARMC ONLY)
Amphetamines, Ur Screen: NOT DETECTED
Barbiturates, Ur Screen: NOT DETECTED
Benzodiazepine, Ur Scrn: NOT DETECTED
Cannabinoid 50 Ng, Ur ~~LOC~~: NOT DETECTED
Cocaine Metabolite,Ur ~~LOC~~: NOT DETECTED
MDMA (Ecstasy)Ur Screen: NOT DETECTED
Methadone Scn, Ur: NOT DETECTED
Opiate, Ur Screen: NOT DETECTED
Phencyclidine (PCP) Ur S: NOT DETECTED
Tricyclic, Ur Screen: NOT DETECTED

## 2018-09-30 LAB — CBC WITH DIFFERENTIAL/PLATELET
Abs Immature Granulocytes: 0.08 10*3/uL — ABNORMAL HIGH (ref 0.00–0.07)
Basophils Absolute: 0 10*3/uL (ref 0.0–0.1)
Basophils Relative: 0 %
Eosinophils Absolute: 0.1 10*3/uL (ref 0.0–0.5)
Eosinophils Relative: 1 %
HCT: 31.6 % — ABNORMAL LOW (ref 39.0–52.0)
Hemoglobin: 10.3 g/dL — ABNORMAL LOW (ref 13.0–17.0)
Immature Granulocytes: 1 %
Lymphocytes Relative: 10 %
Lymphs Abs: 1.2 10*3/uL (ref 0.7–4.0)
MCH: 30.7 pg (ref 26.0–34.0)
MCHC: 32.6 g/dL (ref 30.0–36.0)
MCV: 94.3 fL (ref 80.0–100.0)
Monocytes Absolute: 1.9 10*3/uL — ABNORMAL HIGH (ref 0.1–1.0)
Monocytes Relative: 16 %
Neutro Abs: 8.7 10*3/uL — ABNORMAL HIGH (ref 1.7–7.7)
Neutrophils Relative %: 72 %
Platelets: 324 10*3/uL (ref 150–400)
RBC: 3.35 MIL/uL — ABNORMAL LOW (ref 4.22–5.81)
RDW: 13.8 % (ref 11.5–15.5)
WBC: 12.1 10*3/uL — ABNORMAL HIGH (ref 4.0–10.5)
nRBC: 0 % (ref 0.0–0.2)

## 2018-09-30 LAB — LACTIC ACID, PLASMA: Lactic Acid, Venous: 1.2 mmol/L (ref 0.5–1.9)

## 2018-09-30 LAB — COMPREHENSIVE METABOLIC PANEL
ALT: 11 U/L (ref 0–44)
AST: 13 U/L — ABNORMAL LOW (ref 15–41)
Albumin: 2.9 g/dL — ABNORMAL LOW (ref 3.5–5.0)
Alkaline Phosphatase: 67 U/L (ref 38–126)
Anion gap: 12 (ref 5–15)
BUN: 31 mg/dL — ABNORMAL HIGH (ref 8–23)
CO2: 25 mmol/L (ref 22–32)
Calcium: 8.5 mg/dL — ABNORMAL LOW (ref 8.9–10.3)
Chloride: 97 mmol/L — ABNORMAL LOW (ref 98–111)
Creatinine, Ser: 3.3 mg/dL — ABNORMAL HIGH (ref 0.61–1.24)
GFR calc Af Amer: 20 mL/min — ABNORMAL LOW (ref >60)
GFR calc non Af Amer: 17 mL/min — ABNORMAL LOW (ref >60)
Glucose, Bld: 115 mg/dL — ABNORMAL HIGH (ref 70–99)
Potassium: 4.2 mmol/L (ref 3.5–5.1)
Sodium: 134 mmol/L — ABNORMAL LOW (ref 135–145)
Total Bilirubin: 0.7 mg/dL (ref 0.3–1.2)
Total Protein: 6.6 g/dL (ref 6.5–8.1)

## 2018-09-30 LAB — ETHANOL: Alcohol, Ethyl (B): <10 mg/dL (ref <10)

## 2018-09-30 MED ORDER — SODIUM CHLORIDE 0.9 % IV BOLUS
1000.0000 mL | Freq: Once | INTRAVENOUS | Status: AC
  Administered 2018-09-30: 1000 mL via INTRAVENOUS

## 2018-09-30 MED ORDER — SODIUM CHLORIDE 0.9 % IV BOLUS
1000.0000 mL | Freq: Once | INTRAVENOUS | Status: AC
  Administered 2018-09-30: 18:00:00 1000 mL via INTRAVENOUS

## 2018-09-30 MED ORDER — THIAMINE HCL 100 MG/ML IJ SOLN
100.0000 mg | Freq: Once | INTRAMUSCULAR | Status: AC
  Administered 2018-09-30: 100 mg via INTRAVENOUS
  Filled 2018-09-30: qty 2

## 2018-09-30 MED ORDER — FOLIC ACID 5 MG/ML IJ SOLN
1.0000 mg | Freq: Every day | INTRAMUSCULAR | Status: DC
  Administered 2018-09-30 – 2018-10-01 (×2): 1 mg via INTRAVENOUS
  Filled 2018-09-30 (×2): qty 0.2

## 2018-09-30 MED ORDER — SODIUM CHLORIDE 0.9 % IV SOLN
1.0000 g | Freq: Once | INTRAVENOUS | Status: AC
  Administered 2018-09-30: 1 g via INTRAVENOUS
  Filled 2018-09-30: qty 10

## 2018-09-30 NOTE — ED Notes (Signed)
ED TO INPATIENT HANDOFF REPORT  ED Nurse Name and Phone #: Karena Addison [phone]  S Name/Age/Gender Jannette Fogo 78 y.o. male Room/Bed: ED11A/ED11A  Code Status   Code Status: Prior  Home/SNF/Other Home Patient oriented to: self, place, time and situation Is this baseline? Yes   Triage Complete: Triage complete  Chief Complaint Weakness  Triage Note Pt arrives via ems from home. Ems reports pt recently seen for failure to thrive. Ems reports generalized weakness, with 2 falls reported today. Pt currently taking medication to stop drinking. Ems reports family said he had drank two cans of alcohol. Pt denies drinking any alcohol. Pt alert to self on arrival, unable to states current month, or his age upon arrival. Pt appears lethargic, with som twitching in extremities. NAD noted at this time.   Allergies No Known Allergies  Level of Care/Admitting Diagnosis ED Disposition    ED Disposition Condition Comment   Admit  The patient appears reasonably stabilized for admission considering the current resources, flow, and capabilities available in the ED at this time, and I doubt any other [facility] requiring further screening and/or treatment in the ED prior to admission is  present.       B Medical/Surgery History Past Medical History:  Diagnosis Date  . Acute kidney injury ([place])    a. 07/2013 in setting of C diff;  b. 02/2016.  . C. difficile colitis    a. 07/2013.  Marland Kitchen EtOH dependence (HCC)    a. 3 cans of beer/day.  . Hyperlipemia   . Hypertension   . Phimosis    a. 09/2014 s/p circumcision.  . Prostate cancer ([place])    a. 2015 s/p radiation.  . RBBB   . Seizures ([place])   . Solitary kidney    [name].   Past Surgical History:  Procedure Laterality Date  . Abdominal Surgery & Left Nephrectomy     a. 1968 removal of shrapnel and L nephrectomy.     A IV Location/Drains/Wounds Patient Lines/Drains/Airways Status   Active Line/Drains/Airways    Name:   Placement date:    Placement time:   Site:   Days:   Peripheral IV 09/30/18 Left Wrist   09/30/18    1734    Wrist   less than 1   Peripheral IV 09/30/18 Right Hand   09/30/18    1735    Hand   less than 1   Peripheral IV 09/30/18 Right Arm   09/30/18    1748    Arm   less than 1          Intake/Output Last 24 hours No intake or output data in the 24 hours ending 09/30/18 2240  Labs/Imaging Results for orders placed or performed during the hospital encounter of 09/30/18 (from the past 48 hour(s))  Comprehensive metabolic panel     Status: Abnormal   Collection Time: 09/30/18  5:24 PM  Result Value Ref Range   Sodium 134 (L) 135 - 145 mmol/L   Potassium 4.2 3.5 - 5.1 mmol/L   Chloride 97 (L) 98 - 111 mmol/L   CO2 25 22 - 32 mmol/L   Glucose, Bld 115 (H) 70 - 99 mg/dL   BUN 31 (H) 8 - 23 mg/dL   Creatinine, Ser 3.30 (H) 0.61 - 1.24 mg/dL   Calcium 8.5 (L) 8.9 - 10.3 mg/dL   Total Protein 6.6 6.5 - 8.1 g/dL   Albumin 2.9 (L) 3.5 - 5.0 g/dL   AST 13 (  L) 15 - 41 U/L   ALT 11 0 - 44 U/L   Alkaline Phosphatase 67 38 - 126 U/L   Total Bilirubin 0.7 0.3 - 1.2 mg/dL   GFR calc non Af Amer 17 (L) >60 mL/min   GFR calc Af Amer 20 (L) >60 mL/min   Anion gap 12 5 - 15    Comment: Performed at [facility], [address] [place], [place] [address]  Ethanol     Status: None   Collection Time: 09/30/18  5:24 PM  Result Value Ref Range   Alcohol, Ethyl (B) <10 <10 mg/dL    Comment: (NOTE) Lowest detectable limit for serum alcohol is 10 mg/dL. For medical purposes only. Performed at [facility], [address] [place], [place] [address]   CBC with Differential     Status: Abnormal   Collection Time: 09/30/18  5:24 PM  Result Value Ref Range   WBC 12.1 (H) 4.0 - 10.5 K/uL   RBC 3.35 (L) 4.22 - 5.81 MIL/uL   Hemoglobin 10.3 (L) 13.0 - 17.0 g/dL   HCT 31.6 (L) 39.0 - 52.0 %   MCV 94.3 80.0 - 100.0 fL   MCH 30.7 26.0 - 34.0 pg   MCHC 32.6 30.0 - 36.0 g/dL   RDW 13.8 11.5 - 15.5  %   Platelets 324 150 - 400 K/uL   nRBC 0.0 0.0 - 0.2 %   Neutrophils Relative % 72 %   Neutro Abs 8.7 (H) 1.7 - 7.7 K/uL   Lymphocytes Relative 10 %   Lymphs Abs 1.2 0.7 - 4.0 K/uL   Monocytes Relative 16 %   Monocytes Absolute 1.9 (H) 0.1 - 1.0 K/uL   Eosinophils Relative 1 %   Eosinophils Absolute 0.1 0.0 - 0.5 K/uL   Basophils Relative 0 %   Basophils Absolute 0.0 0.0 - 0.1 K/uL   Immature Granulocytes 1 %   Abs Immature Granulocytes 0.08 (H) 0.00 - 0.07 K/uL    Comment: Performed at [facility], [address] [place], [place] [address]  Lactic acid, plasma     Status: None   Collection Time: 09/30/18  5:25 PM  Result Value Ref Range   Lactic Acid, Venous 1.2 0.5 - 1.9 mmol/L    Comment: Performed at [facility], [address] [place], [place] [address]  Urinalysis, Complete w Microscopic     Status: Abnormal   Collection Time: 09/30/18  5:29 PM  Result Value Ref Range   Color, Urine YELLOW (A) YELLOW   APPearance TURBID (A) CLEAR   Specific Gravity, Urine 1.009 1.005 - 1.030   pH 5.0 5.0 - 8.0   Glucose, UA NEGATIVE NEGATIVE mg/dL   Hgb urine dipstick MODERATE (A) NEGATIVE   Bilirubin Urine NEGATIVE NEGATIVE   Ketones, ur 5 (A) NEGATIVE mg/dL   Protein, ur 30 (A) NEGATIVE mg/dL   Nitrite NEGATIVE NEGATIVE   Leukocytes,Ua LARGE (A) NEGATIVE   RBC / HPF 21-50 0 - 5 RBC/hpf   WBC, UA >50 (H) 0 - 5 WBC/hpf   Bacteria, UA MANY (A) NONE SEEN   Squamous Epithelial / LPF 0-5 0 - 5   WBC Clumps PRESENT     Comment: Performed at [facility], [address] [place], [place] [address]  Urine Drug Screen, Qualitative     Status: None   Collection Time: 09/30/18  5:29 PM  Result Value Ref Range   Tricyclic, Ur Screen NONE DETECTED NONE DETECTED  Amphetamines, Ur Screen NONE DETECTED NONE DETECTED   MDMA (Ecstasy)Ur Screen NONE DETECTED NONE DETECTED   Cocaine Metabolite,Ur [place] NONE DETECTED NONE DETECTED   Opiate, Ur Screen NONE DETECTED  NONE DETECTED   Phencyclidine (PCP) Ur S NONE DETECTED NONE DETECTED   Cannabinoid 50 Ng, Ur [place] NONE DETECTED NONE DETECTED   Barbiturates, Ur Screen NONE DETECTED NONE DETECTED   Benzodiazepine, Ur Scrn NONE DETECTED NONE DETECTED   Methadone Scn, Ur NONE DETECTED NONE DETECTED    Comment: (NOTE) Tricyclics + metabolites, urine    Cutoff 1000 ng/mL Amphetamines + metabolites, urine  Cutoff 1000 ng/mL MDMA (Ecstasy), urine              Cutoff 500 ng/mL Cocaine Metabolite, urine          Cutoff 300 ng/mL Opiate + metabolites, urine        Cutoff 300 ng/mL Phencyclidine (PCP), urine         Cutoff 25 ng/mL Cannabinoid, urine                 Cutoff 50 ng/mL Barbiturates + metabolites, urine  Cutoff 200 ng/mL Benzodiazepine, urine              Cutoff 200 ng/mL Methadone, urine                   Cutoff 300 ng/mL The urine drug screen provides only a preliminary, unconfirmed analytical test result and should not be used for non-medical purposes. Clinical consideration and professional judgment should be applied to any positive drug screen result due to possible interfering substances. A more specific alternate chemical method must be used in order to obtain a confirmed analytical result. Gas chromatography / mass spectrometry (GC/MS) is the preferred confirmat ory method. Performed at [facility], [address] [place], [place] [address]    Ct Head Wo Contrast  Result Date: 09/30/2018 CLINICAL DATA:  Altered level of consciousness, failure to thrive, history hypertension EXAM: CT HEAD WITHOUT CONTRAST TECHNIQUE: Contiguous axial images were obtained from the base of the skull through the vertex without intravenous contrast. Sagittal and coronal MPR images reconstructed from axial data set. COMPARISON:  05/08/2014 FINDINGS: Brain: Generalized atrophy. Normal ventricular morphology. No midline shift or mass effect. Small vessel chronic ischemic changes of deep cerebral white  matter. 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 Beam hardening artifacts at pons. No intracranial hemorrhage, mass lesion, evidence of acute infarction, or extra-axial fluid collection. Vascular: No definite hyperdense vessels. Skull: Intact Sinuses/Orbits: Clear Other: N/A IMPRESSION: Atrophy with small vessel chronic ischemic changes of deep cerebral white matter. 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 No acute intracranial abnormalities. Electronically Signed   By: Lavonia Dana M.D.   On: 09/30/2018 18:09   Dg Chest Portable 1 View  Result Date: 09/30/2018 CLINICAL DATA:  Weakness EXAM: PORTABLE CHEST 1 VIEW COMPARISON:  Chest x-ray dated 04/17/2016 FINDINGS: The heart size and mediastinal contours are within normal limits. Both lungs are clear. The visualized skeletal structures are unremarkable. IMPRESSION: No active disease. Electronically Signed   By: Constance Holster M.D.   On: 09/30/2018 17:58    Pending Labs Unresulted Labs (From admission, onward)    Start     Ordered   09/30/18 2132  SARS Coronavirus 2 (CEPHEID- Performed in [place] hospital lab), Hosp Order  (Symptomatic Patients Labs with Precautions )  ONCE - STAT,   STAT     09/30/18 2132   09/30/18 1724  Urine  culture  Once,   STAT     09/30/18 1724          Vitals/Pain Today's Vitals   09/30/18 2000 09/30/18 2030 09/30/18 2100 09/30/18 2213  BP: 128/69 (!) 117/43 131/83 127/74  Pulse:  84  74  Resp: 14 (!) 38  17  Temp:      TempSrc:      SpO2:  100%  99%  Weight:      Height:      PainSc:        Isolation Precautions Droplet and Contact precautions  Medications Medications  folic acid injection 1 mg (1 mg Intravenous Given 09/30/18 1821)  sodium chloride 0.9 % bolus 1,000 mL (0 mLs Intravenous Stopped 09/30/18 2106)  thiamine (B-1) injection 100 mg (100 mg Intravenous Given 09/30/18 1821)  sodium chloride 0.9 % bolus 1,000 mL (0 mLs Intravenous Stopped 09/30/18 2106)  cefTRIAXone  (ROCEPHIN) 1 g in sodium chloride 0.9 % 100 mL IVPB (1 g Intravenous New Bag/Given 09/30/18 2143)    Mobility walks High fall risk   Focused Assessments    R Recommendations: See Admitting Provider Note  Report given to:

## 2018-09-30 NOTE — ED Notes (Signed)
Pt seems more alert and responsive at this time. Able to remove wallet from back pocket without assistance, and look for insurance cards.

## 2018-09-30 NOTE — ED Provider Notes (Signed)
[facility] Emergency Department Provider Note  ____________________________________________  Time seen: Approximately 10:10 PM  I have reviewed the triage vital signs and the nursing notes.   HISTORY  Chief Complaint Weakness; Failure To Thrive; and Fall    Level 5 Caveat: Portions of the History and Physical including HPI and review of systems are unable to be completely obtained due to patient being a poor historian   HPI [name] is a 78 y.o. male who comes the ED today from home via EMS due to generalized weakness, multiple falls at home today.  Patient denies any complaints and states that he feels fine.  EMS report that the patient is on some sort of medication to help him stop drinking.  Family suspects that he was drinking earlier today.    Review of electronic medical record shows that [facility], where the nurse practitioner found him to have hypotension and directed him to go to the emergency department.  They refused EMS transport and said they would go to the [facility] ED, but it appears that they never went.      Past Medical History:  Diagnosis Date  . Acute kidney injury ([place])    a. 07/2013 in setting of C diff;  b. 02/2016.  . C. difficile colitis    a. 07/2013.  Marland Kitchen EtOH dependence (HCC)    a. 3 cans of beer/day.  . Hyperlipemia   . Hypertension   . Phimosis    a. 09/2014 s/p circumcision.  . Prostate cancer ([place])    a. 2015 s/p radiation.  . RBBB   . Seizures ([place])   . Solitary kidney    [name].     Patient Active Problem List   Diagnosis Date Noted  . Hyperlipemia   . Hypertension   . Hypotension   . Metabolic acidosis, normal anion gap (NAG)   . Near syncope   . H/O unilateral nephrectomy   . Bilateral carotid artery stenosis   . AKI (acute kidney injury) ([place]) 03/09/2016     Past Surgical History:  Procedure Laterality Date  . Abdominal Surgery & Left Nephrectomy     a. 1968 removal of shrapnel and L nephrectomy.     Prior to Admission medications   Medication Sig Start Date End Date Taking? Authorizing Provider  aspirin EC 81 MG tablet Take 81 mg by mouth daily.    [provider]  chlordiazePOXIDE (LIBRIUM) 25 MG capsule Take 1 tablet 5 times a day on the first day, then decrease by 1 tablet daily until gone 03/17/18   Earleen Newport, MD  FLUoxetine (PROZAC) 20 MG capsule Take 40 mg by mouth every morning.    [provider]  levETIRAcetam (KEPPRA) 500 MG tablet Take 1 tablet (500 mg total) by mouth 2 (two) times daily. 03/17/18   Earleen Newport, MD  pantoprazole (PROTONIX) 40 MG tablet Take 40 mg by mouth every morning.    [provider]  tamsulosin (FLOMAX) 0.4 MG CAPS capsule Take 0.4 mg by mouth daily after supper. *Take 30 minutes after evening meal*    [provider]  traZODone (DESYREL) 100 MG tablet Take 200 mg by mouth at bedtime.     [provider]     Allergies Patient has no known allergies.   No family history on file.  Social History Social History   Tobacco Use  . Smoking status: Never Smoker  .  Smokeless tobacco: Never Used  Substance Use Topics  . Alcohol use: Yes    Alcohol/week: 21.0 standard drinks    Types: 21 Cans of beer per week    Comment: 3 12oz cans a day-- states he has cut back to 2 12 oz cans daily  . Drug use: No    Review of Systems Level 5 Caveat: Portions of the History and Physical including HPI and review of systems are unable to be completely obtained due to patient being a poor historian   Constitutional:   No known fever.  ENT:   No rhinorrhea. Cardiovascular:   No chest pain or syncope. Respiratory:   No dyspnea or cough. Gastrointestinal:   Negative for abdominal pain, vomiting and diarrhea.  Musculoskeletal:   Negative for focal pain or swelling ____________________________________________   PHYSICAL EXAM:  VITAL SIGNS: ED  Triage Vitals  Enc Vitals Group     BP 09/30/18 1644 92/62     Pulse Rate 09/30/18 1656 76     Resp 09/30/18 1656 19     Temp 09/30/18 1656 98.9 F (37.2 C)     Temp Source 09/30/18 1656 Oral     SpO2 09/30/18 1649 92 %     Weight 09/30/18 1657 139 lb 15.9 oz (63.5 kg)     Height 09/30/18 1657 5\' 8"  (1.727 m)     Head Circumference --      Peak Flow --      Pain Score 09/30/18 1657 0     Pain Loc --      Pain Edu? --      Excl. in [place]? --     Vital signs reviewed, nursing assessments reviewed.   Constitutional:   Alert and oriented to self. Non-toxic appearance. Eyes:   Conjunctivae are normal. EOMI. PERRL. ENT      Head:   Normocephalic and atraumatic.      Nose:   No congestion/rhinnorhea.       Mouth/Throat:   Dry mucous membranes, no pharyngeal erythema. No peritonsillar mass.       Neck:   No meningismus. Full ROM. Hematological/Lymphatic/Immunilogical:   No cervical lymphadenopathy. Cardiovascular:   RRR. Symmetric bilateral radial and DP pulses.  No murmurs. Cap refill less than 2 seconds. Respiratory:   Normal respiratory effort without tachypnea/retractions. Breath sounds are clear and equal bilaterally. No wheezes/rales/rhonchi. Gastrointestinal:   Soft and nontender. Non distended. There is no CVA tenderness.  No rebound, rigidity, or guarding. Musculoskeletal:   Normal range of motion in all extremities. No joint effusions.  No lower extremity tenderness.  No edema. Neurologic:   Normal speech, limited language expression.  Motor grossly intact. Patient tremulous with cerebellar function testing  Skin:    Skin is warm, dry and intact. No rash noted.  No petechiae, purpura, or bullae.  ____________________________________________    LABS (pertinent positives/negatives) (all labs ordered are listed, but only abnormal results are displayed) Labs Reviewed  COMPREHENSIVE METABOLIC PANEL - Abnormal; Notable for the following components:      Result Value   Sodium  134 (*)    Chloride 97 (*)    Glucose, Bld 115 (*)    BUN 31 (*)    Creatinine, Ser 3.30 (*)    Calcium 8.5 (*)    Albumin 2.9 (*)    AST 13 (*)    GFR calc non Af Amer 17 (*)    GFR calc Af Amer 20 (*)    All other components within normal  limits  CBC WITH DIFFERENTIAL/PLATELET - Abnormal; Notable for the following components:   WBC 12.1 (*)    RBC 3.35 (*)    Hemoglobin 10.3 (*)    HCT 31.6 (*)    Neutro Abs 8.7 (*)    Monocytes Absolute 1.9 (*)    Abs Immature Granulocytes 0.08 (*)    All other components within normal limits  URINALYSIS, COMPLETE (UACMP) WITH MICROSCOPIC - Abnormal; Notable for the following components:   Color, Urine YELLOW (*)    APPearance TURBID (*)    Hgb urine dipstick MODERATE (*)    Ketones, ur 5 (*)    Protein, ur 30 (*)    Leukocytes,Ua LARGE (*)    WBC, UA >50 (*)    Bacteria, UA MANY (*)    All other components within normal limits  URINE CULTURE  SARS CORONAVIRUS 2 (HOSPITAL ORDER, PERFORMED IN [facility])  ETHANOL  URINE DRUG SCREEN, QUALITATIVE ([facility] ONLY)  LACTIC ACID, PLASMA   ____________________________________________   EKG  Interpreted by me Sinus rhythm rate of 72, normal axis and intervals.  Right bundle branch block.  No acute ischemic changes.  ____________________________________________    RADIOLOGY  Ct Head Wo Contrast  Result Date: 09/30/2018 CLINICAL DATA:  Altered level of consciousness, failure to thrive, history hypertension EXAM: CT HEAD WITHOUT CONTRAST TECHNIQUE: Contiguous axial images were obtained from the base of the skull through the vertex without intravenous contrast. Sagittal and coronal MPR images reconstructed from axial data set. COMPARISON:  05/08/2014 FINDINGS: Brain: Generalized atrophy. Normal ventricular morphology. No midline shift or mass effect. Small vessel chronic ischemic changes of deep cerebral white matter. 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 Beam hardening  artifacts at pons. No intracranial hemorrhage, mass lesion, evidence of acute infarction, or extra-axial fluid collection. Vascular: No definite hyperdense vessels. Skull: Intact Sinuses/Orbits: Clear Other: N/A IMPRESSION: Atrophy with small vessel chronic ischemic changes of deep cerebral white matter. 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 No acute intracranial abnormalities. Electronically Signed   By: Lavonia Dana M.D.   On: 09/30/2018 18:09   Dg Chest Portable 1 View  Result Date: 09/30/2018 CLINICAL DATA:  Weakness EXAM: PORTABLE CHEST 1 VIEW COMPARISON:  Chest x-ray dated 04/17/2016 FINDINGS: The heart size and mediastinal contours are within normal limits. Both lungs are clear. The visualized skeletal structures are unremarkable. IMPRESSION: No active disease. Electronically Signed   By: Constance Holster M.D.   On: 09/30/2018 17:58    ____________________________________________   PROCEDURES Procedures  ____________________________________________  DIFFERENTIAL DIAGNOSIS   Dehydration, alcohol intoxication, malnutrition/starvation ketoacidosis, intracranial hemorrhage, pneumonia, urinary tract infection.  CLINICAL IMPRESSION / ASSESSMENT AND PLAN / ED COURSE  Pertinent labs & imaging results that were available during my care of the patient were reviewed by me and considered in my medical decision making (see chart for details).   [name] was evaluated in Emergency Department on 09/30/2018 for the symptoms described in the history of present illness. He was evaluated in the context of the global COVID-19 pandemic, which necessitated consideration that the patient might be at risk for infection with the SARS-CoV-2 virus that causes COVID-19. Institutional protocols and algorithms that pertain to the evaluation of patients at risk for COVID-19 are in a state of rapid change based on information released by regulatory bodies including the CDC and federal and state  organizations. These policies and algorithms were followed during the patient's care in the ED.   Patient presents with confusion, multiple falls  today.  On initial assessment is found to have hypotension with blood pressure of about 85/55, map right at 65.  He is not septic on initial assessment.  Patient given 2 L of IV fluids for hydration, labs checked which do show acute on chronic renal insufficiency and urinary tract infection.  CT of the head is unremarkable, chest x-ray unremarkable.  Ceftriaxone ordered, urine culture added, case discussed with hospitalist for further management.  Blood pressure normalized after IV fluids.  Mental status improved with hydration.      ____________________________________________   FINAL CLINICAL IMPRESSION(S) / ED DIAGNOSES    Final diagnoses:  Cystitis  AKI (acute kidney injury) ([place])  Confusion  Fall in home, initial encounter  Hypotension, unspecified hypotension type  Dehydration   ED Discharge Orders    None      Portions of this note were generated with dragon dictation software. Dictation errors may occur despite best attempts at proofreading.   Carrie Mew, MD 09/30/18 2215

## 2018-09-30 NOTE — H&P (Signed)
[facility] NAME: [name]    MR#:  [id number]  DATE OF BIRTH:  08-03-1940  DATE OF ADMISSION:  09/30/2018  PRIMARY CARE PHYSICIAN: System, Pcp Not In   REQUESTING/REFERRING PHYSICIAN: Joni Fears, MD  CHIEF COMPLAINT:   Chief Complaint  Patient presents with  . Weakness  . Failure To Thrive  . Fall    HISTORY OF PRESENT ILLNESS:  [name]  is a 78 y.o. male who presents with chief complaint as above.  Patient presents the ED with a complaint of 2 weeks of increasing weakness and fatigue.  Family brought him in today thinking that perhaps he had been drinking.  However, the patient's alcohol level is negative here in the ED, as well as his urine tox screen.  He was found to have a UTI as well as some AKI.  Hospitalist were called for admission  PAST MEDICAL HISTORY:   Past Medical History:  Diagnosis Date  . Acute kidney injury ([place])    a. 07/2013 in setting of C diff;  b. 02/2016.  . C. difficile colitis    a. 07/2013.  Marland Kitchen EtOH dependence (HCC)    a. 3 cans of beer/day.  . Hyperlipemia   . Hypertension   . Phimosis    a. 09/2014 s/p circumcision.  . Prostate cancer ([place])    a. 2015 s/p radiation.  . RBBB   . Seizures ([place])   . Solitary kidney    [name].     PAST SURGICAL HISTORY:   Past Surgical History:  Procedure Laterality Date  . Abdominal Surgery & Left Nephrectomy     a. 1968 removal of shrapnel and L nephrectomy.     SOCIAL HISTORY:   Social History   Tobacco Use  . Smoking status: Never Smoker  . Smokeless tobacco: Never Used  Substance Use Topics  . Alcohol use: Yes    Alcohol/week: 21.0 standard drinks    Types: 21 Cans of beer per week    Comment: 3 12oz cans a day-- states he has cut back to 2 12 oz cans daily     FAMILY HISTORY:    Family history reviewed and is non-contributory DRUG ALLERGIES:  No Known Allergies  MEDICATIONS AT HOME:   Prior to  Admission medications   Medication Sig Start Date End Date Taking? Authorizing Provider  aspirin EC 81 MG tablet Take 81 mg by mouth daily.    [provider]  chlordiazePOXIDE (LIBRIUM) 25 MG capsule Take 1 tablet 5 times a day on the first day, then decrease by 1 tablet daily until gone 03/17/18   Earleen Newport, MD  FLUoxetine (PROZAC) 20 MG capsule Take 40 mg by mouth every morning.    [provider]  levETIRAcetam (KEPPRA) 500 MG tablet Take 1 tablet (500 mg total) by mouth 2 (two) times daily. 03/17/18   Earleen Newport, MD  pantoprazole (PROTONIX) 40 MG tablet Take 40 mg by mouth every morning.    [provider]  tamsulosin (FLOMAX) 0.4 MG CAPS capsule Take 0.4 mg by mouth daily after supper. *Take 30 minutes after evening meal*    [provider]  traZODone (DESYREL) 100 MG tablet Take 200 mg by mouth at bedtime.     [provider]    REVIEW OF SYSTEMS:  Review of Systems  Constitutional: Positive for malaise/fatigue. Negative for chills, fever and weight loss.  HENT: Negative for ear  pain, hearing loss and tinnitus.   Eyes: Negative for blurred vision, double vision, pain and redness.  Respiratory: Negative for cough, hemoptysis and shortness of breath.   Cardiovascular: Negative for chest pain, palpitations, orthopnea and leg swelling.  Gastrointestinal: Negative for abdominal pain, constipation, diarrhea, nausea and vomiting.  Genitourinary: Negative for dysuria, frequency and hematuria.  Musculoskeletal: Negative for back pain, joint pain and neck pain.  Skin:       No acne, rash, or lesions  Neurological: Negative for dizziness, tremors, focal weakness and weakness.  Endo/Heme/Allergies: Negative for polydipsia. Does not bruise/bleed easily.  Psychiatric/Behavioral: Negative for depression. The patient is not nervous/anxious and does not have insomnia.      VITAL SIGNS:   Vitals:   09/30/18 2000 09/30/18 2030  09/30/18 2100 09/30/18 2213  BP: 128/69 (!) 117/43 131/83 127/74  Pulse:  84  74  Resp: 14 (!) 38  17  Temp:      TempSrc:      SpO2:  100%  99%  Weight:      Height:       Wt Readings from Last 3 Encounters:  09/30/18 63.5 kg  09/27/18 63.5 kg  03/17/18 76.2 kg    PHYSICAL EXAMINATION:  Physical Exam  Vitals reviewed. Constitutional: He is oriented to person, place, and time. He appears well-developed and well-nourished. No distress.  HENT:  Head: Normocephalic and atraumatic.  Mouth/Throat: Oropharynx is clear and moist.  Eyes: Pupils are equal, round, and reactive to light. Conjunctivae and EOM are normal. No scleral icterus.  Neck: Normal range of motion. Neck supple. No JVD present. No thyromegaly present.  Cardiovascular: Normal rate, regular rhythm and intact distal pulses. Exam reveals no gallop and no friction rub.  No murmur heard. Respiratory: Effort normal and breath sounds normal. No respiratory distress. He has no wheezes. He has no rales.  GI: Soft. Bowel sounds are normal. He exhibits no distension. There is no abdominal tenderness.  Musculoskeletal: Normal range of motion.        General: No edema.     Comments: No arthritis, no gout  Lymphadenopathy:    He has no cervical adenopathy.  Neurological: He is alert and oriented to person, place, and time. No cranial nerve deficit.  No dysarthria, no aphasia  Skin: Skin is warm and dry. No rash noted. No erythema.  Psychiatric: He has a normal mood and affect. His behavior is normal. Judgment and thought content normal.    LABORATORY PANEL:   CBC Recent Labs  Lab 09/30/18 1724  WBC 12.1*  HGB 10.3*  HCT 31.6*  PLT 324   ------------------------------------------------------------------------------------------------------------------  Chemistries  Recent Labs  Lab 09/30/18 1724  NA 134*  K 4.2  CL 97*  CO2 25  GLUCOSE 115*  BUN 31*  CREATININE 3.30*  CALCIUM 8.5*  AST 13*  ALT 11  ALKPHOS  67  BILITOT 0.7   ------------------------------------------------------------------------------------------------------------------  Cardiac Enzymes No results for input(s): TROPONINI in the last 168 hours. ------------------------------------------------------------------------------------------------------------------  RADIOLOGY:  Ct Head Wo Contrast  Result Date: 09/30/2018 CLINICAL DATA:  Altered level of consciousness, failure to thrive, history hypertension EXAM: CT HEAD WITHOUT CONTRAST TECHNIQUE: Contiguous axial images were obtained from the base of the skull through the vertex without intravenous contrast. Sagittal and coronal MPR images reconstructed from axial data set. COMPARISON:  05/08/2014 FINDINGS: Brain: Generalized atrophy. Normal ventricular morphology. No midline shift or mass effect. Small vessel chronic ischemic changes of deep cerebral white matter. 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 Beam hardening artifacts at pons. No intracranial hemorrhage, mass lesion, evidence of acute infarction, or extra-axial fluid collection. Vascular: No definite hyperdense vessels. Skull: Intact Sinuses/Orbits: Clear Other: N/A IMPRESSION: Atrophy with small vessel chronic ischemic changes of deep cerebral white matter. 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 No acute intracranial abnormalities. Electronically Signed   By: Lavonia Dana M.D.   On: 09/30/2018 18:09   Dg Chest Portable 1 View  Result Date: 09/30/2018 CLINICAL DATA:  Weakness EXAM: PORTABLE CHEST 1 VIEW COMPARISON:  Chest x-ray dated 04/17/2016 FINDINGS: The heart size and mediastinal contours are within normal limits. Both lungs are clear. The visualized skeletal structures are unremarkable. IMPRESSION: No active disease. Electronically Signed   By: Constance Holster M.D.   On: 09/30/2018 17:58    EKG:   Orders placed or performed during the hospital encounter of 09/30/18  . ED EKG  . ED EKG  . EKG  12-Lead  . EKG 12-Lead    IMPRESSION AND PLAN:  Principal Problem:   UTI (urinary tract infection) -IV antibiotics initiated, urine culture sent Active Problems:   Acute on chronic renal failure (HCC) -likely due to his UTI.  Patient states he only has 1 kidney, stating that he lost his left kidney in [place].  We will hydrate with fluids tonight and monitor his renal function, avoid nephrotoxins, treatment for UTI as above   Hypertension -continue home dose antihypertensives   Hyperlipemia -Home dose antilipid   Alcohol use -CIWA protocol  Chart review performed and case discussed with ED provider. Labs, imaging and/or ECG reviewed by provider and discussed with patient/family. Management plans discussed with the patient and/or family.  COVID-19 status: Tested negative     DVT PROPHYLAXIS: SubQ heparin  GI PROPHYLAXIS:  None  ADMISSION STATUS: Inpatient     CODE STATUS: Full Code Status History    Date Active Date Inactive Code Status Order ID Comments User Context   03/09/2016 2229 03/11/2016 1501 Full Code [id number]  Hugelmeyer, Ubaldo Glassing, DO ED      TOTAL TIME TAKING CARE OF THIS PATIENT: 45 minutes.   This patient was evaluated in the context of the global COVID-19 pandemic, which necessitated consideration that the patient might be at risk for infection with the SARS-CoV-2 virus that causes COVID-19. Institutional protocols and algorithms that pertain to the evaluation of patients at risk for COVID-19 are in a state of rapid change based on information released by regulatory bodies including the CDC and federal and state organizations. These policies and algorithms were followed to the best of this provider's knowledge to date during the patient's care at this facility.  Ethlyn Daniels 09/30/2018, 11:51 PM  [facility]  Office  [phone]  CC: Primary care physician; System, Pcp Not In  Note:  This document was prepared using Dragon voice recognition  software and may include unintentional dictation errors.

## 2018-09-30 NOTE — ED Triage Notes (Signed)
Pt arrives via ems from home. Ems reports pt recently seen for failure to thrive. Ems reports generalized weakness, with 2 falls reported today. Pt currently taking medication to stop drinking. Ems reports family said he had drank two cans of alcohol. Pt denies drinking any alcohol. Pt alert to self on arrival, unable to states current month, or his age upon arrival. Pt appears lethargic, with som twitching in extremities. NAD noted at this time.

## 2018-09-30 NOTE — ED Notes (Signed)
Patient transported to CT 

## 2018-10-01 LAB — CBC
HCT: 31.5 % — ABNORMAL LOW (ref 39.0–52.0)
Hemoglobin: 10.5 g/dL — ABNORMAL LOW (ref 13.0–17.0)
MCH: 30.9 pg (ref 26.0–34.0)
MCHC: 33.3 g/dL (ref 30.0–36.0)
MCV: 92.6 fL (ref 80.0–100.0)
Platelets: 276 10*3/uL (ref 150–400)
RBC: 3.4 MIL/uL — ABNORMAL LOW (ref 4.22–5.81)
RDW: 13.7 % (ref 11.5–15.5)
WBC: 11.6 10*3/uL — ABNORMAL HIGH (ref 4.0–10.5)
nRBC: 0 % (ref 0.0–0.2)

## 2018-10-01 LAB — BASIC METABOLIC PANEL
Anion gap: 12 (ref 5–15)
BUN: 26 mg/dL — ABNORMAL HIGH (ref 8–23)
CO2: 24 mmol/L (ref 22–32)
Calcium: 8.5 mg/dL — ABNORMAL LOW (ref 8.9–10.3)
Chloride: 103 mmol/L (ref 98–111)
Creatinine, Ser: 2.1 mg/dL — ABNORMAL HIGH (ref 0.61–1.24)
GFR calc Af Amer: 35 mL/min — ABNORMAL LOW (ref >60)
GFR calc non Af Amer: 30 mL/min — ABNORMAL LOW (ref >60)
Glucose, Bld: 96 mg/dL (ref 70–99)
Potassium: 4.7 mmol/L (ref 3.5–5.1)
Sodium: 139 mmol/L (ref 135–145)

## 2018-10-01 MED ORDER — PANTOPRAZOLE SODIUM 40 MG PO TBEC
40.0000 mg | DELAYED_RELEASE_TABLET | ORAL | Status: DC
  Administered 2018-10-01: 40 mg via ORAL
  Filled 2018-10-01 (×2): qty 1

## 2018-10-01 MED ORDER — LORAZEPAM 2 MG/ML IJ SOLN: 1.0000 mg | Freq: Four times a day (QID) | INTRAMUSCULAR | Status: DC | PRN

## 2018-10-01 MED ORDER — HEPARIN SODIUM (PORCINE) 5000 UNIT/ML IJ SOLN
5000.0000 [IU] | Freq: Three times a day (TID) | INTRAMUSCULAR | Status: DC
  Administered 2018-10-01 (×2): 5000 [IU] via SUBCUTANEOUS
  Filled 2018-10-01 (×2): qty 1

## 2018-10-01 MED ORDER — ACETAMINOPHEN 325 MG PO TABS: 650.0000 mg | ORAL_TABLET | Freq: Four times a day (QID) | ORAL | Status: DC | PRN

## 2018-10-01 MED ORDER — ADULT MULTIVITAMIN W/MINERALS CH
1.0000 | ORAL_TABLET | Freq: Every day | ORAL | Status: DC
  Administered 2018-10-01: 1 via ORAL
  Filled 2018-10-01: qty 1

## 2018-10-01 MED ORDER — SODIUM CHLORIDE 0.9 % IV SOLN
1.0000 g | INTRAVENOUS | Status: DC
  Administered 2018-10-01: 1 g via INTRAVENOUS
  Filled 2018-10-01: qty 1

## 2018-10-01 MED ORDER — LEVETIRACETAM 500 MG PO TABS
500.0000 mg | ORAL_TABLET | Freq: Two times a day (BID) | ORAL | Status: DC
  Administered 2018-10-01 (×2): 500 mg via ORAL
  Filled 2018-10-01 (×2): qty 1

## 2018-10-01 MED ORDER — OXYCODONE HCL 5 MG PO TABS: 5.0000 mg | ORAL_TABLET | ORAL | Status: DC | PRN

## 2018-10-01 MED ORDER — LORAZEPAM 2 MG/ML IJ SOLN
0.0000 mg | Freq: Four times a day (QID) | INTRAMUSCULAR | Status: DC
  Administered 2018-10-01 (×2): 1 mg via INTRAVENOUS
  Filled 2018-10-01 (×2): qty 1

## 2018-10-01 MED ORDER — LORAZEPAM 2 MG/ML IJ SOLN: 0.0000 mg | Freq: Two times a day (BID) | INTRAMUSCULAR | Status: DC

## 2018-10-01 MED ORDER — ONDANSETRON HCL 4 MG PO TABS: 4.0000 mg | ORAL_TABLET | Freq: Four times a day (QID) | ORAL | Status: DC | PRN

## 2018-10-01 MED ORDER — VITAMIN B-1 100 MG PO TABS
100.0000 mg | ORAL_TABLET | Freq: Every day | ORAL | Status: DC
  Administered 2018-10-01: 100 mg via ORAL
  Filled 2018-10-01: qty 1

## 2018-10-01 MED ORDER — ONDANSETRON HCL 4 MG/2ML IJ SOLN: 4.0000 mg | Freq: Four times a day (QID) | INTRAMUSCULAR | Status: DC | PRN

## 2018-10-01 MED ORDER — ASPIRIN EC 81 MG PO TBEC
81.0000 mg | DELAYED_RELEASE_TABLET | Freq: Every day | ORAL | Status: DC
  Administered 2018-10-01: 81 mg via ORAL
  Filled 2018-10-01: qty 1

## 2018-10-01 MED ORDER — THIAMINE HCL 100 MG/ML IJ SOLN: 100.0000 mg | Freq: Every day | INTRAMUSCULAR | Status: DC

## 2018-10-01 MED ORDER — LORAZEPAM 1 MG PO TABS: 1.0000 mg | ORAL_TABLET | Freq: Four times a day (QID) | ORAL | Status: DC | PRN

## 2018-10-01 MED ORDER — ACETAMINOPHEN 650 MG RE SUPP: 650.0000 mg | Freq: Four times a day (QID) | RECTAL | Status: DC | PRN

## 2018-10-01 NOTE — Progress Notes (Signed)
Patient is being transferred to [facility].  I spoke to Hassan Rowan, RN from 6A at [facility] to give report on patient.  Dr. Jannifer Franklin is signing the emtala form and transfer papers.  IVs are intact and saline locked.  Patient will be going to to room 22 bed 1 on 6A.  Patient informed and wife will be called regarding transfer.  EMS will be picking patient up shortly.  Christene Slates  10/01/2018  11:16 PM

## 2018-10-01 NOTE — ED Notes (Signed)
ED TO INPATIENT HANDOFF REPORT  ED Nurse Name and Phone #: Anson Crofts Name/Age/Gender Jannette Fogo 78 y.o. male Room/Bed: ED11A/ED11A  Code Status   Code Status: Prior  Home/SNF/Other Home Patient oriented to: self, place, time and situation Is this baseline? Yes   Triage Complete: Triage complete  Chief Complaint Weakness  Triage Note Pt arrives via ems from home. Ems reports pt recently seen for failure to thrive. Ems reports generalized weakness, with 2 falls reported today. Pt currently taking medication to stop drinking. Ems reports family said he had drank two cans of alcohol. Pt denies drinking any alcohol. Pt alert to self on arrival, unable to states current month, or his age upon arrival. Pt appears lethargic, with som twitching in extremities. NAD noted at this time.   Allergies No Known Allergies  Level of Care/Admitting Diagnosis ED Disposition    ED Disposition Condition [facility] Area: [place] [[id number]]  Level of Care: Med-Surg [16]  Covid Evaluation: N/A  Diagnosis: UTI (urinary tract infection) [277824]  Admitting Physician: Lance Coon [[id number]]  Attending Physician: Lance Coon [phone]  Estimated length of stay: past midnight tomorrow  Certification:: I certify this patient will need inpatient services for at least 2 midnights  PT Class (Do Not Modify): Inpatient [101]  PT Acc Code (Do Not Modify): Private [1]       B Medical/Surgery History Past Medical History:  Diagnosis Date  . Acute kidney injury ([place])    a. 07/2013 in setting of C diff;  b. 02/2016.  . C. difficile colitis    a. 07/2013.  Marland Kitchen EtOH dependence (HCC)    a. 3 cans of beer/day.  . Hyperlipemia   . Hypertension   . Phimosis    a. 09/2014 s/p circumcision.  . Prostate cancer ([place])    a. 2015 s/p radiation.  . RBBB   . Seizures ([place])   . Solitary kidney    [name].   Past Surgical History:  Procedure Laterality  Date  . Abdominal Surgery & Left Nephrectomy     a. 1968 removal of shrapnel and L nephrectomy.     A IV Location/Drains/Wounds Patient Lines/Drains/Airways Status   Active Line/Drains/Airways    Name:   Placement date:   Placement time:   Site:   Days:   Peripheral IV 09/30/18 Left Wrist   09/30/18    1734    Wrist   1   Peripheral IV 09/30/18 Right Hand   09/30/18    1735    Hand   1   Peripheral IV 09/30/18 Right Arm   09/30/18    1748    Arm   1          Intake/Output Last 24 hours No intake or output data in the 24 hours ending 10/01/18 0005  Labs/Imaging Results for orders placed or performed during the hospital encounter of 09/30/18 (from the past 48 hour(s))  Comprehensive metabolic panel     Status: Abnormal   Collection Time: 09/30/18  5:24 PM  Result Value Ref Range   Sodium 134 (L) 135 - 145 mmol/L   Potassium 4.2 3.5 - 5.1 mmol/L   Chloride 97 (L) 98 - 111 mmol/L   CO2 25 22 - 32 mmol/L   Glucose, Bld 115 (H) 70 - 99 mg/dL   BUN 31 (H) 8 - 23 mg/dL   Creatinine, Ser 3.30 (H) 0.61 - 1.24 mg/dL   Calcium  8.5 (L) 8.9 - 10.3 mg/dL   Total Protein 6.6 6.5 - 8.1 g/dL   Albumin 2.9 (L) 3.5 - 5.0 g/dL   AST 13 (L) 15 - 41 U/L   ALT 11 0 - 44 U/L   Alkaline Phosphatase 67 38 - 126 U/L   Total Bilirubin 0.7 0.3 - 1.2 mg/dL   GFR calc non Af Amer 17 (L) >60 mL/min   GFR calc Af Amer 20 (L) >60 mL/min   Anion gap 12 5 - 15    Comment: Performed at [facility], [address] [place], [place] [address]  Ethanol     Status: None   Collection Time: 09/30/18  5:24 PM  Result Value Ref Range   Alcohol, Ethyl (B) <10 <10 mg/dL    Comment: (NOTE) Lowest detectable limit for serum alcohol is 10 mg/dL. For medical purposes only. Performed at [facility], [address] [place], [place] [address]   CBC with Differential     Status: Abnormal   Collection Time: 09/30/18  5:24 PM  Result Value Ref Range   WBC 12.1 (H) 4.0 - 10.5 K/uL   RBC 3.35  (L) 4.22 - 5.81 MIL/uL   Hemoglobin 10.3 (L) 13.0 - 17.0 g/dL   HCT 31.6 (L) 39.0 - 52.0 %   MCV 94.3 80.0 - 100.0 fL   MCH 30.7 26.0 - 34.0 pg   MCHC 32.6 30.0 - 36.0 g/dL   RDW 13.8 11.5 - 15.5 %   Platelets 324 150 - 400 K/uL   nRBC 0.0 0.0 - 0.2 %   Neutrophils Relative % 72 %   Neutro Abs 8.7 (H) 1.7 - 7.7 K/uL   Lymphocytes Relative 10 %   Lymphs Abs 1.2 0.7 - 4.0 K/uL   Monocytes Relative 16 %   Monocytes Absolute 1.9 (H) 0.1 - 1.0 K/uL   Eosinophils Relative 1 %   Eosinophils Absolute 0.1 0.0 - 0.5 K/uL   Basophils Relative 0 %   Basophils Absolute 0.0 0.0 - 0.1 K/uL   Immature Granulocytes 1 %   Abs Immature Granulocytes 0.08 (H) 0.00 - 0.07 K/uL    Comment: Performed at [facility], [address] [place], [place] [address]  Lactic acid, plasma     Status: None   Collection Time: 09/30/18  5:25 PM  Result Value Ref Range   Lactic Acid, Venous 1.2 0.5 - 1.9 mmol/L    Comment: Performed at [facility], [address] [place], [place] [address]  Urinalysis, Complete w Microscopic     Status: Abnormal   Collection Time: 09/30/18  5:29 PM  Result Value Ref Range   Color, Urine YELLOW (A) YELLOW   APPearance TURBID (A) CLEAR   Specific Gravity, Urine 1.009 1.005 - 1.030   pH 5.0 5.0 - 8.0   Glucose, UA NEGATIVE NEGATIVE mg/dL   Hgb urine dipstick MODERATE (A) NEGATIVE   Bilirubin Urine NEGATIVE NEGATIVE   Ketones, ur 5 (A) NEGATIVE mg/dL   Protein, ur 30 (A) NEGATIVE mg/dL   Nitrite NEGATIVE NEGATIVE   Leukocytes,Ua LARGE (A) NEGATIVE   RBC / HPF 21-50 0 - 5 RBC/hpf   WBC, UA >50 (H) 0 - 5 WBC/hpf   Bacteria, UA MANY (A) NONE SEEN   Squamous Epithelial / LPF 0-5 0 - 5   WBC Clumps PRESENT     Comment: Performed at [facility], [address] [place],  [address]  Urine Drug Screen, Qualitative  Status: None   Collection Time: 09/30/18  5:29 PM  Result Value Ref Range   Tricyclic, Ur Screen NONE DETECTED NONE DETECTED    Amphetamines, Ur Screen NONE DETECTED NONE DETECTED   MDMA (Ecstasy)Ur Screen NONE DETECTED NONE DETECTED   Cocaine Metabolite,Ur [place] NONE DETECTED NONE DETECTED   Opiate, Ur Screen NONE DETECTED NONE DETECTED   Phencyclidine (PCP) Ur S NONE DETECTED NONE DETECTED   Cannabinoid 50 Ng, Ur [place] NONE DETECTED NONE DETECTED   Barbiturates, Ur Screen NONE DETECTED NONE DETECTED   Benzodiazepine, Ur Scrn NONE DETECTED NONE DETECTED   Methadone Scn, Ur NONE DETECTED NONE DETECTED    Comment: (NOTE) Tricyclics + metabolites, urine    Cutoff 1000 ng/mL Amphetamines + metabolites, urine  Cutoff 1000 ng/mL MDMA (Ecstasy), urine              Cutoff 500 ng/mL Cocaine Metabolite, urine          Cutoff 300 ng/mL Opiate + metabolites, urine        Cutoff 300 ng/mL Phencyclidine (PCP), urine         Cutoff 25 ng/mL Cannabinoid, urine                 Cutoff 50 ng/mL Barbiturates + metabolites, urine  Cutoff 200 ng/mL Benzodiazepine, urine              Cutoff 200 ng/mL Methadone, urine                   Cutoff 300 ng/mL The urine drug screen provides only a preliminary, unconfirmed analytical test result and should not be used for non-medical purposes. Clinical consideration and professional judgment should be applied to any positive drug screen result due to possible interfering substances. A more specific alternate chemical method must be used in order to obtain a confirmed analytical result. Gas chromatography / mass spectrometry (GC/MS) is the preferred confirmat ory method. Performed at [facility], [address] [place], [place] [address]   SARS Coronavirus 2 (CEPHEID- Performed in [facility] hospital lab), Hosp Order     Status: None   Collection Time: 09/30/18  9:41 PM  Result Value Ref Range   SARS Coronavirus 2 NEGATIVE NEGATIVE    Comment: (NOTE) If result is NEGATIVE SARS-CoV-2 target nucleic acids are NOT DETECTED. The SARS-CoV-2 RNA is generally detectable in upper and  lower  respiratory specimens during the acute phase of infection. The lowest  concentration of SARS-CoV-2 viral copies this assay can detect is 250  copies / mL. A negative result does not preclude SARS-CoV-2 infection  and should not be used as the sole basis for treatment or other  patient management decisions.  A negative result may occur with  improper specimen collection / handling, submission of specimen other  than nasopharyngeal swab, presence of viral mutation(s) within the  areas targeted by this assay, and inadequate number of viral copies  (<250 copies / mL). A negative result must be combined with clinical  observations, patient history, and epidemiological information. If result is POSITIVE SARS-CoV-2 target nucleic acids are DETECTED. The SARS-CoV-2 RNA is generally detectable in upper and lower  respiratory specimens dur ing the acute phase of infection.  Positive  results are indicative of active infection with SARS-CoV-2.  Clinical  correlation with patient history and other diagnostic information is  necessary to determine patient infection status.  Positive results do  not rule out bacterial infection or co-infection with other viruses. If result  is PRESUMPTIVE POSTIVE SARS-CoV-2 nucleic acids MAY BE PRESENT.   A presumptive positive result was obtained on the submitted specimen  and confirmed on repeat testing.  While 2019 novel coronavirus  (SARS-CoV-2) nucleic acids may be present in the submitted sample  additional confirmatory testing may be necessary for epidemiological  and / or clinical management purposes  to differentiate between  SARS-CoV-2 and other Sarbecovirus currently known to infect humans.  If clinically indicated additional testing with an alternate test  methodology [phone]) is advised. The SARS-CoV-2 RNA is generally  detectable in upper and lower respiratory sp ecimens during the acute  phase of infection. The expected result is  Negative. Fact Sheet for Patients:  StrictlyIdeas.no Fact Sheet for Healthcare Providers: BankingDealers.co.za This test is not yet approved or cleared by the [place] FDA and has been authorized for detection and/or diagnosis of SARS-CoV-2 by FDA under an Emergency Use Authorization (EUA).  This EUA will remain in effect (meaning this test can be used) for the duration of the COVID-19 declaration under Section 564(b)(1) of the Act, 21 U.S.C. section 360bbb-3(b)(1), unless the authorization is terminated or revoked sooner. Performed at [facility], [address] [place], [place] [address]    Ct Head Wo Contrast  Result Date: 09/30/2018 CLINICAL DATA:  Altered level of consciousness, failure to thrive, history hypertension EXAM: CT HEAD WITHOUT CONTRAST TECHNIQUE: Contiguous axial images were obtained from the base of the skull through the vertex without intravenous contrast. Sagittal and coronal MPR images reconstructed from axial data set. COMPARISON:  05/08/2014 FINDINGS: Brain: Generalized atrophy. Normal ventricular morphology. No midline shift or mass effect. Small vessel chronic ischemic changes of deep cerebral white matter. 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 Beam hardening artifacts at pons. No intracranial hemorrhage, mass lesion, evidence of acute infarction, or extra-axial fluid collection. Vascular: No definite hyperdense vessels. Skull: Intact Sinuses/Orbits: Clear Other: N/A IMPRESSION: Atrophy with small vessel chronic ischemic changes of deep cerebral white matter. 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 No acute intracranial abnormalities. Electronically Signed   By: Lavonia Dana M.D.   On: 09/30/2018 18:09   Dg Chest Portable 1 View  Result Date: 09/30/2018 CLINICAL DATA:  Weakness EXAM: PORTABLE CHEST 1 VIEW COMPARISON:  Chest x-ray dated 04/17/2016 FINDINGS: The heart size and mediastinal  contours are within normal limits. Both lungs are clear. The visualized skeletal structures are unremarkable. IMPRESSION: No active disease. Electronically Signed   By: Constance Holster M.D.   On: 09/30/2018 17:58    Pending Labs Unresulted Labs (From admission, onward)    Start     Ordered   09/30/18 1724  Urine culture  Once,   STAT     09/30/18 1724   Signed and Held  CBC  (heparin)  Once,   R    Comments:  Baseline for heparin therapy IF NOT ALREADY DRAWN.  Notify MD if PLT < 100 K.    Signed and Held   Signed and Held  Creatinine, serum  (heparin)  Once,   R    Comments:  Baseline for heparin therapy IF NOT ALREADY DRAWN.    Signed and Held   Signed and Held  Basic metabolic panel  Tomorrow morning,   R     Signed and Held   Signed and Held  CBC  Tomorrow morning,   R     Signed and Held          Vitals/Pain Today's Vitals   09/30/18 2000 09/30/18 2030  09/30/18 2100 09/30/18 2213  BP: 128/69 (!) 117/43 131/83 127/74  Pulse:  84  74  Resp: 14 (!) 38  17  Temp:      TempSrc:      SpO2:  100%  99%  Weight:      Height:      PainSc:        Isolation Precautions Droplet and Contact precautions  Medications Medications  folic acid injection 1 mg (1 mg Intravenous Given 09/30/18 1821)  sodium chloride 0.9 % bolus 1,000 mL (0 mLs Intravenous Stopped 09/30/18 2106)  thiamine (B-1) injection 100 mg (100 mg Intravenous Given 09/30/18 1821)  sodium chloride 0.9 % bolus 1,000 mL (0 mLs Intravenous Stopped 09/30/18 2106)  cefTRIAXone (ROCEPHIN) 1 g in sodium chloride 0.9 % 100 mL IVPB (0 g Intravenous Stopped 09/30/18 2258)    Mobility walks with person assist High fall risk   Focused Assessments Renal Assessment Handoff:  Hemodialysis Schedule: no Last Hemodialysis date and time: non   Restricted appendage:      R Recommendations: See Admitting Provider Note  Report given to:   Additional Notes:

## 2018-10-01 NOTE — Progress Notes (Signed)
[facility] [place] NAME: [name]    MR#:  [id number]  DATE OF BIRTH:  08/13/1940  SUBJECTIVE:  CHIEF COMPLAINT:   Chief Complaint  Patient presents with  . Weakness  . Failure To Thrive  . Fall  Patient seen and evaluated today More verbally responsive No fever No complaints of shortness of breath  REVIEW OF SYSTEMS:    ROS  CONSTITUTIONAL: No documented fever. Has fatigue, weakness. No weight gain, no weight loss.  EYES: No blurry or double vision.  ENT: No tinnitus. No postnasal drip. No redness of the oropharynx.  RESPIRATORY: No cough, no wheeze, no hemoptysis. No dyspnea.  CARDIOVASCULAR: No chest pain. No orthopnea. No palpitations. No syncope.  GASTROINTESTINAL: No nausea, no vomiting or diarrhea. No abdominal pain. No melena or hematochezia.  GENITOURINARY: No dysuria or hematuria.  ENDOCRINE: No polyuria or nocturia. No heat or cold intolerance.  HEMATOLOGY: No anemia. No bruising. No bleeding.  INTEGUMENTARY: No rashes. No lesions.  MUSCULOSKELETAL: No arthritis. No swelling. No gout.  NEUROLOGIC: No numbness, tingling, or ataxia. No seizure-type activity.  PSYCHIATRIC: No anxiety. No insomnia. No ADD.   DRUG ALLERGIES:  No Known Allergies  VITALS:  Blood pressure 125/77, pulse 87, temperature 98.1 F (36.7 C), temperature source Oral, resp. rate 18, height 5\' 8"  (1.727 m), weight 63.5 kg, SpO2 90 %.  PHYSICAL EXAMINATION:   Physical Exam  GENERAL:  78 y.o.-year-old patient lying in the bed with no acute distress.  EYES: Pupils equal, round, reactive to light and accommodation. No scleral icterus. Extraocular muscles intact.  HEENT: Head atraumatic, normocephalic. Oropharynx and nasopharynx clear.  NECK:  Supple, no jugular venous distention. No thyroid enlargement, no tenderness.  LUNGS: Normal breath sounds bilaterally, no wheezing, rales, rhonchi. No use of accessory muscles of respiration.   CARDIOVASCULAR: S1, S2 normal. No murmurs, rubs, or gallops.  ABDOMEN: Soft, nontender, nondistended. Bowel sounds present. No organomegaly or mass.  EXTREMITIES: No cyanosis, clubbing or edema b/l.    NEUROLOGIC: Cranial nerves II through XII are intact. No focal Motor or sensory deficits b/l.   PSYCHIATRIC: The patient is alert and oriented x 3.  SKIN: No obvious rash, lesion, or ulcer.   LABORATORY PANEL:   CBC Recent Labs  Lab 09/30/18 1724  WBC 12.1*  HGB 10.3*  HCT 31.6*  PLT 324   ------------------------------------------------------------------------------------------------------------------ Chemistries  Recent Labs  Lab 09/30/18 1724  NA 134*  K 4.2  CL 97*  CO2 25  GLUCOSE 115*  BUN 31*  CREATININE 3.30*  CALCIUM 8.5*  AST 13*  ALT 11  ALKPHOS 67  BILITOT 0.7   ------------------------------------------------------------------------------------------------------------------  Cardiac Enzymes No results for input(s): TROPONINI in the last 168 hours. ------------------------------------------------------------------------------------------------------------------  RADIOLOGY:  Ct Head Wo Contrast  Result Date: 09/30/2018 CLINICAL DATA:  Altered level of consciousness, failure to thrive, history hypertension EXAM: CT HEAD WITHOUT CONTRAST TECHNIQUE: Contiguous axial images were obtained from the base of the skull through the vertex without intravenous contrast. Sagittal and coronal MPR images reconstructed from axial data set. COMPARISON:  05/08/2014 FINDINGS: Brain: Generalized atrophy. Normal ventricular morphology. No midline shift or mass effect. Small vessel chronic ischemic changes of deep cerebral white matter. 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 Beam hardening artifacts at pons. No intracranial hemorrhage, mass lesion, evidence of acute infarction, or extra-axial fluid collection. Vascular: No definite hyperdense vessels. Skull: Intact  Sinuses/Orbits: Clear Other: N/A IMPRESSION: Atrophy with small vessel chronic ischemic changes of deep  cerebral white matter. 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 No acute intracranial abnormalities. Electronically Signed   By: Lavonia Dana M.D.   On: 09/30/2018 18:09   Dg Chest Portable 1 View  Result Date: 09/30/2018 CLINICAL DATA:  Weakness EXAM: PORTABLE CHEST 1 VIEW COMPARISON:  Chest x-ray dated 04/17/2016 FINDINGS: The heart size and mediastinal contours are within normal limits. Both lungs are clear. The visualized skeletal structures are unremarkable. IMPRESSION: No active disease. Electronically Signed   By: Constance Holster M.D.   On: 09/30/2018 17:58     ASSESSMENT AND PLAN:   78 year old male patient with history of renal insufficiency, hyperlipidemia, hypertension, prostate cancer, seizure disorder, solitary kidney, nephrectomy currently under hospitalist service for weakness  -Acute on chronic kidney disease stage III IV fluid hydration Avoid nephrotoxic medication Monitor renal function  -Acute urinary tract infection IV antibiotics Continue IV Rocephin antibiotic  -History of solitary kidney  -History of alcohol abuse CIWA protocol  -Seizure disorder Continue Keppra  All the records are reviewed and case discussed with Care Management/Social Worker. Management plans discussed with the patient, family and they are in agreement.  CODE STATUS: Full code  DVT Prophylaxis: SCDs  TOTAL TIME TAKING CARE OF THIS PATIENT: 36 minutes.   POSSIBLE D/C IN 2 to 3 DAYS, DEPENDING ON CLINICAL CONDITION.  Saundra Shelling M.D on 10/01/2018 at 9:15 AM  Between 7am to 6pm - Pager - [phone]  After 6pm go to [email or web address] - password [email or web address] [facility]  Office  [phone]  CC: Primary care physician; System, Pcp Not In  Note: This dictation was prepared with Dragon dictation along with smaller phrase technology. Any transcriptional  errors that result from this process are unintentional.

## 2018-10-01 NOTE — Care Management (Signed)
Called received from patient's wife Mrs. Mulford.  She notified this nurse case manger that patient is service connected with [facility].  Wife states "I want him transferred today".  Wife states that she is dissatisfied that the hospital doesn't have diapers to put him in, and that on a previous admission the patient was told to self cath and that is not what the [place] recommends.   Consent to transfer completed with wife, witnessed form with bedside RN Misty due to patient not being oriented at this time.   Medical form and clinical faxed to [phone].  Per Rayann Heman at [facility] if patient is to discharge within 48-72 hours they don't typically pursue transfer.  Rayann Heman was notified that wife is insistent on transfer. Rayann Heman has requested records and states they will be reviewed.   RNCM contacted wife with an update and notified her that the [place] has been contacted and clinical has been sent. Wife again states "I want him to be transferred today".  Wife notified that [facility] would have to [facility] by [facility], be would have to be available, [facility] would then contact [facility] and notify that transfer had been accepted, then transport would be set up if a bed is available. She was not pleased with this answer, but acknowledged understanding.  Camera operator and bedside RN notified.

## 2018-10-01 NOTE — Evaluation (Signed)
Physical Therapy Evaluation Patient Details Name: [name] MRN: [id number] DOB: 06-13-1940 Today's Date: 10/01/2018   History of Present Illness  78 y.o. male who presents with chief complaint as above.  Patient presents the ED with a complaint of 2 weeks of increasing weakness and fatigue. Pt's family suspected that he had been drinking, no ETOH found with testing.   Clinical Impression  Pt with some mild confusion, but able to participate with PT and generally did well.  He had some mild unsteadiness initially with ambulation but was safe with walker and no LOBs.  He had increased speed and confidence with cuing, encouragement and increased distance. He was actually able to ambulate the last 35 ft w/o AD, though he was slower and more guarded with this portion of gait training.  Pt reports he feels that he can be safe at home but is not at his baseline, agreeable to HHPT coming to the home to work with him.      Follow Up Recommendations Home health PT    Equipment Recommendations  None recommended by PT    Recommendations for Other Services       Precautions / Restrictions Precautions Precautions: Fall Restrictions Weight Bearing Restrictions: No      Mobility  Bed Mobility Overal bed mobility: Independent             General bed mobility comments: Pt able to get to sitting w/o assist, good confidence  Transfers Overall transfer level: Modified independent Equipment used: Rolling walker (2 wheeled)             General transfer comment: Pt is able to rise to standing with no direct assist, needed walker to [employer] balance  Ambulation/Gait Ambulation/Gait assistance: Supervision Gait Distance (Feet): 200 Feet Assistive device: Rolling walker (2 wheeled)       General Gait Details: Slow but steady ambulation with light reliance on walker, last 35 ft w/o AD with slower cadence, but good overall tolerance.  No LOBs but per pt slower and less steady than his  baseline.  Stairs            Wheelchair Mobility    Modified Rankin (Stroke Patients Only)       Balance Overall balance assessment: Modified Independent                                           Pertinent Vitals/Pain Pain Assessment: No/denies pain    Home Living Family/patient expects to be discharged to:: Private residence Living Arrangements: Spouse/significant other   Type of Home: House Home Access: Stairs to enter   Technical brewer of Steps: 3 Home Layout: One level Home Equipment: Environmental consultant - 2 wheels      Prior Function Level of Independence: Independent with assistive device(s)         Comments: Pt reports that he does not drive, but is able to be out in the community with family w/o issue     Hand Dominance        Extremity/Trunk Assessment   Upper Extremity Assessment Upper Extremity Assessment: Overall WFL for tasks assessed    Lower Extremity Assessment Lower Extremity Assessment: Overall WFL for tasks assessed       Communication   Communication: No difficulties  Cognition Arousal/Alertness: Awake/alert(sleeping on arrival, but easily to wake) Behavior During Therapy: WFL for tasks assessed/performed Overall Cognitive Status: Difficult  to assess                                 General Comments: Pt had some confusion about year (his birthday and current date) but interacted relatively well      General Comments      Exercises     Assessment/Plan    PT Assessment Patient needs continued PT services  PT Problem List Decreased strength;Decreased activity tolerance;Decreased balance;Decreased range of motion;Decreased mobility;Decreased knowledge of use of DME;Decreased safety awareness       PT Treatment Interventions DME instruction;Gait training;Stair training;Functional mobility training;Therapeutic activities;Therapeutic exercise;Balance training;Patient/family education;Neuromuscular  re-education    PT Goals (Current goals can be found in the Care Plan section)  Acute Rehab PT Goals Patient Stated Goal: Go home PT Goal Formulation: With patient Time For Goal Achievement: 10/15/18 Potential to Achieve Goals: Good    Frequency Min 2X/week   Barriers to discharge        Co-evaluation               AM-PAC PT "6 Clicks" Mobility  Outcome Measure Help needed turning from your back to your side while in a flat bed without using bedrails?: None Help needed moving from lying on your back to sitting on the side of a flat bed without using bedrails?: None Help needed moving to and from a bed to a chair (including a wheelchair)?: None Help needed standing up from a chair using your arms (e.g., wheelchair or bedside chair)?: A Little Help needed to walk in hospital room?: A Little Help needed climbing 3-5 steps with a railing? : A Little 6 Click Score: 21    End of Session Equipment Utilized During Treatment: Gait belt Activity Tolerance: Patient tolerated treatment well Patient left: with bed alarm set;with call bell/phone within reach Nurse Communication: Mobility status PT Visit Diagnosis: Muscle weakness (generalized) (M62.81);Difficulty in walking, not elsewhere classified (R26.2)    Time: 2778-2423 PT Time Calculation (min) (ACUTE ONLY): 25 min   Charges:   PT Evaluation $PT Eval Low Complexity: 1 Low PT Treatments $Gait Training: 8-22 mins        Kreg Shropshire, DPT 10/01/2018, 4:15 PM

## 2018-10-01 NOTE — Discharge Summary (Signed)
[place] [facility] [place] NAME: Maejor Erven    MR#:  [id number]  DATE OF BIRTH:  Nov 08, 1940  DATE OF ADMISSION:  09/30/2018 ADMITTING PHYSICIAN: Lance Coon, MD  DATE OF DISCHARGE: 10/01/2018  PRIMARY CARE PHYSICIAN: System, Pcp Not In   ADMISSION DIAGNOSIS:  Confusion [R41.0] AKI (acute kidney injury) ([place]) [N17.9] Cystitis [N30.90] Fall in home, initial encounter [W19.XXXA, Y92.009] Hypotension, unspecified hypotension type [I95.9]  DISCHARGE DIAGNOSIS:  Principal Problem:   UTI (urinary tract infection) Active Problems:   Acute on chronic renal failure (HCC)   Hyperlipemia   Hypertension   Alcohol use Altered mental status  SECONDARY DIAGNOSIS:   Past Medical History:  Diagnosis Date  . Acute kidney injury ([place])    a. 07/2013 in setting of C diff;  b. 02/2016.  . C. difficile colitis    a. 07/2013.  Marland Kitchen EtOH dependence (HCC)    a. 3 cans of beer/day.  . Hyperlipemia   . Hypertension   . Phimosis    a. 09/2014 s/p circumcision.  . Prostate cancer ([place])    a. 2015 s/p radiation.  . RBBB   . Seizures ([place])   . Solitary kidney    [name].     ADMITTING HISTORY Sirus Labrie  is a 78 y.o. male who presents with chief complaint as above.  Patient presents the ED with a complaint of 2 weeks of increasing weakness and fatigue.  Family brought him in today thinking that perhaps he had been drinking.  However, the patient's alcohol level is negative here in the ED, as well as his urine tox screen.  He was found to have a UTI as well as some AKI.  Hospitalist were called for admission  HOSPITAL COURSE:  Patient admitted to medical floor. Worked up with CT head which showed no acute abnormality.Received IV fluids for renal failure and was started on iv rocephin antibiotic for UTI.CIWA protocol instituted for alcohol abuse. Family wanted transfer to [place] was continued for seizure disorder. Patient has bed at [facility] and has been accepted.Patient will be transfered.  CONSULTS OBTAINED:    DRUG ALLERGIES:  No Known Allergies  DISCHARGE MEDICATIONS:   Allergies as of 10/01/2018   No Known Allergies     Medication List    TAKE these medications   aspirin EC 81 MG tablet Take 81 mg by mouth daily.   chlordiazePOXIDE 25 MG capsule Commonly known as:  LIBRIUM Take 1 tablet 5 times a day on the first day, then decrease by 1 tablet daily until gone   FLUoxetine 20 MG capsule Commonly known as:  PROZAC Take 40 mg by mouth every morning.   levETIRAcetam 500 MG tablet Commonly known as:  Keppra Take 1 tablet (500 mg total) by mouth 2 (two) times daily.   pantoprazole 40 MG tablet Commonly known as:  PROTONIX Take 40 mg by mouth every morning.   tamsulosin 0.4 MG Caps capsule Commonly known as:  FLOMAX Take 0.4 mg by mouth daily after supper. *Take 30 minutes after evening meal*   traZODone 100 MG tablet Commonly known as:  DESYREL Take 200 mg by mouth at bedtime.       Today  Patient seen today No new complaints No fever  VITAL SIGNS:  Blood pressure (!) 143/80, pulse 77, temperature 97.9 F (36.6 C), resp. rate 18, height 5\' 8"  (1.727 m), weight 63.5 kg, SpO2 90 %.  I/O:  Intake/Output Summary (Last 24 hours) at 10/01/2018 1835 Last data filed at 10/01/2018 1300 Gross per 24 hour  Intake 480 ml  Output 500 ml  Net -20 ml    PHYSICAL EXAMINATION:  Physical Exam  GENERAL:  78 y.o.-year-old patient lying in the bed with no acute distress.  LUNGS: Normal breath sounds bilaterally, no wheezing, rales,rhonchi or crepitation. No use of accessory muscles of respiration.  CARDIOVASCULAR: S1, S2 normal. No murmurs, rubs, or gallops.  ABDOMEN: Soft, non-tender, non-distended. Bowel sounds present. No organomegaly or mass.  NEUROLOGIC: Moves all 4 extremities. PSYCHIATRIC: The patient is alert and oriented x 2.  SKIN: No obvious rash, lesion, or ulcer.   DATA  REVIEW:   CBC Recent Labs  Lab 10/01/18 0931  WBC 11.6*  HGB 10.5*  HCT 31.5*  PLT 276    Chemistries  Recent Labs  Lab 09/30/18 1724 10/01/18 0931  NA 134* 139  K 4.2 4.7  CL 97* 103  CO2 25 24  GLUCOSE 115* 96  BUN 31* 26*  CREATININE 3.30* 2.10*  CALCIUM 8.5* 8.5*  AST 13*  --   ALT 11  --   ALKPHOS 67  --   BILITOT 0.7  --     Cardiac Enzymes No results for input(s): TROPONINI in the last 168 hours.  Microbiology Results  Results for orders placed or performed during the hospital encounter of 09/30/18  SARS Coronavirus 2 (CEPHEID- Performed in [place] hospital lab), Hosp Order     Status: None   Collection Time: 09/30/18  9:41 PM  Result Value Ref Range Status   SARS Coronavirus 2 NEGATIVE NEGATIVE Final    Comment: (NOTE) If result is NEGATIVE SARS-CoV-2 target nucleic acids are NOT DETECTED. The SARS-CoV-2 RNA is generally detectable in upper and lower  respiratory specimens during the acute phase of infection. The lowest  concentration of SARS-CoV-2 viral copies this assay can detect is 250  copies / mL. A negative result does not preclude SARS-CoV-2 infection  and should not be used as the sole basis for treatment or other  patient management decisions.  A negative result may occur with  improper specimen collection / handling, submission of specimen other  than nasopharyngeal swab, presence of viral mutation(s) within the  areas targeted by this assay, and inadequate number of viral copies  (<250 copies / mL). A negative result must be combined with clinical  observations, patient history, and epidemiological information. If result is POSITIVE SARS-CoV-2 target nucleic acids are DETECTED. The SARS-CoV-2 RNA is generally detectable in upper and lower  respiratory specimens dur ing the acute phase of infection.  Positive  results are indicative of active infection with SARS-CoV-2.  Clinical  correlation with patient history and other diagnostic  information is  necessary to determine patient infection status.  Positive results do  not rule out bacterial infection or co-infection with other viruses. If result is PRESUMPTIVE POSTIVE SARS-CoV-2 nucleic acids MAY BE PRESENT.   A presumptive positive result was obtained on the submitted specimen  and confirmed on repeat testing.  While 2019 novel coronavirus  (SARS-CoV-2) nucleic acids may be present in the submitted sample  additional confirmatory testing may be necessary for epidemiological  and / or clinical management purposes  to differentiate between  SARS-CoV-2 and other Sarbecovirus currently known to infect humans.  If clinically indicated additional testing with an alternate test  methodology [phone]) is advised. The SARS-CoV-2 RNA is generally  detectable in upper and lower respiratory sp ecimens  during the acute  phase of infection. The expected result is Negative. Fact Sheet for Patients:  StrictlyIdeas.no Fact Sheet for Healthcare Providers: BankingDealers.co.za This test is not yet approved or cleared by the [place] FDA and has been authorized for detection and/or diagnosis of SARS-CoV-2 by FDA under an Emergency Use Authorization (EUA).  This EUA will remain in effect (meaning this test can be used) for the duration of the COVID-19 declaration under Section 564(b)(1) of the Act, 21 U.S.C. section 360bbb-3(b)(1), unless the authorization is terminated or revoked sooner. Performed at [facility], [address] [place], [place] [address]     RADIOLOGY:  Ct Head Wo Contrast  Result Date: 09/30/2018 CLINICAL DATA:  Altered level of consciousness, failure to thrive, history hypertension EXAM: CT HEAD WITHOUT CONTRAST TECHNIQUE: Contiguous axial images were obtained from the base of the skull through the vertex without intravenous contrast. Sagittal and coronal MPR images reconstructed from axial data  set. COMPARISON:  05/08/2014 FINDINGS: Brain: Generalized atrophy. Normal ventricular morphology. No midline shift or mass effect. Small vessel chronic ischemic changes of deep cerebral white matter. 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 Beam hardening artifacts at pons. No intracranial hemorrhage, mass lesion, evidence of acute infarction, or extra-axial fluid collection. Vascular: No definite hyperdense vessels. Skull: Intact Sinuses/Orbits: Clear Other: N/A IMPRESSION: Atrophy with small vessel chronic ischemic changes of deep cerebral white matter. 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 No acute intracranial abnormalities. Electronically Signed   By: Lavonia Dana M.D.   On: 09/30/2018 18:09   Dg Chest Portable 1 View  Result Date: 09/30/2018 CLINICAL DATA:  Weakness EXAM: PORTABLE CHEST 1 VIEW COMPARISON:  Chest x-ray dated 04/17/2016 FINDINGS: The heart size and mediastinal contours are within normal limits. Both lungs are clear. The visualized skeletal structures are unremarkable. IMPRESSION: No active disease. Electronically Signed   By: Constance Holster M.D.   On: 09/30/2018 17:58    Follow up with PCP in 1 week.  Management plans discussed with the patient, family and they are in agreement.  CODE STATUS: Full code    Code Status Orders  (From admission, onward)         Start     Ordered   10/01/18 0040  Full code  Continuous     10/01/18 0039        Code Status History    Date Active Date Inactive Code Status Order ID Comments User Context   03/09/2016 2229 03/11/2016 1501 Full Code [id number]  Hugelmeyer, Ubaldo Glassing, DO ED      TOTAL TIME TAKING CARE OF THIS PATIENT ON DAY OF DISCHARGE: more than 35 minutes.   Saundra Shelling M.D on 10/01/2018 at 6:35 PM  Between 7am to 6pm - Pager - [phone]  After 6pm go to [email or web address] - password [email or web address] [facility]  Office  [phone]  CC: Primary care physician; System, Pcp Not  In  Note: This dictation was prepared with Dragon dictation along with smaller phrase technology. Any transcriptional errors that result from this process are unintentional.

## 2018-10-01 NOTE — Progress Notes (Signed)
Patient's spouse contacted the unit and the secretary transferred the call to my phone. The spouse stated she had not been able to get in touch with her husband. I stated to the spouse that he has been asleep most of the day. The patient's wife refused my statement and became irate and hung the phone up. I went to the patient's room and woke the patient up and dialed the number for him so the spouse could speak with her husband. The patient's spouse then asked to speak to me again and began to ask why he had not answered the phone again and also why he has not eaten anything today. I stated to the patient's spouse that the patient has eaten some but did not eat a lot as he was not very hungry. She then began to question me if I knew what he had on his plate for each meal. The spouse also stated we were delaying the transfer to the [place], "so we could run up a hospital bill." I reminded the spouse that the documentation was faxed to the [place] coordinator today by the CNM and the transfer may not happen until tomorrow. I also reminded her that it was dicussed with her by CNM that if she did not want to wait or disagreed with the current status that she could take the patient home, but if the physician had not released the patient yet then she would have to sign the patient out AMA.   Fuller Mandril, RN

## 2018-10-03 LAB — URINE CULTURE: Culture: >=100000 — AB

## 2020-12-31 IMAGING — DX PORTABLE CHEST - 1 VIEW
1 series · 1 of 1 positions shown · non-contrast
Comparison: Chest x-ray dated 04/17/2016

CLINICAL DATA: Weakness

EXAM:
PORTABLE CHEST 1 VIEW

[chest ap]
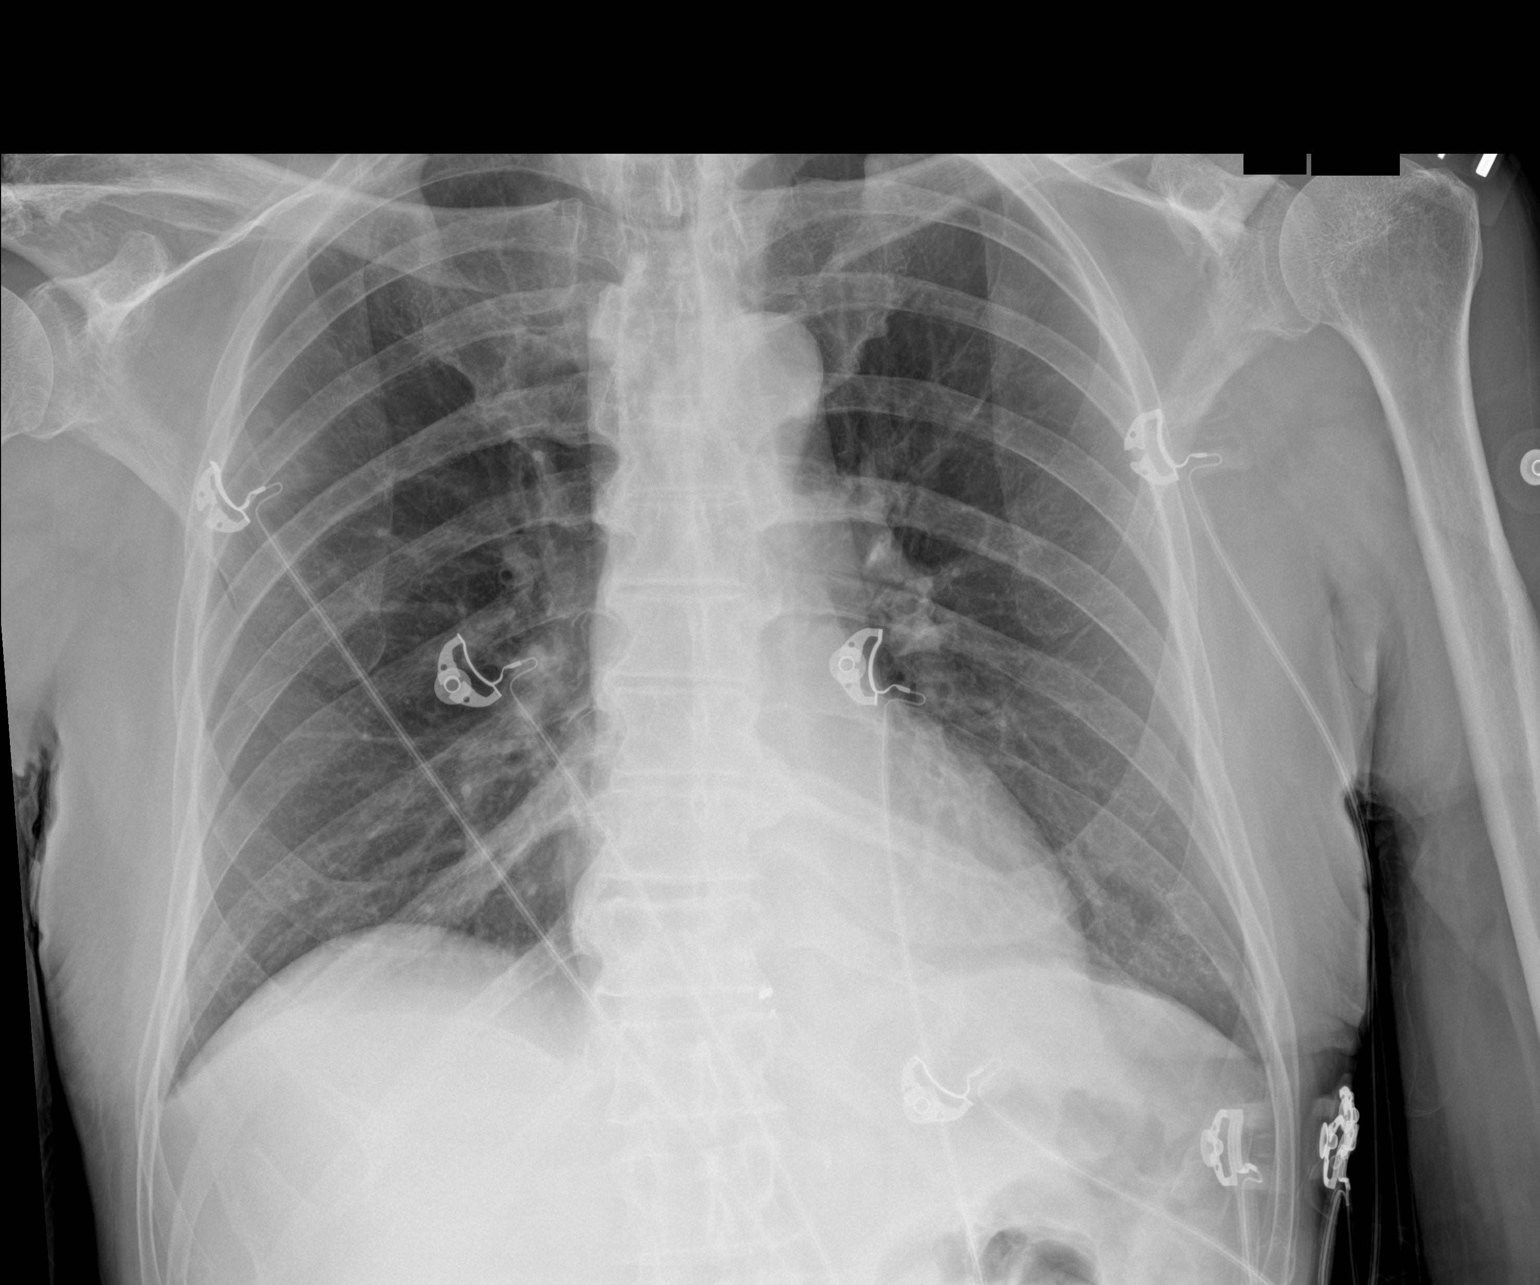

[1 of 1 positions shown; findings below may reference images not displayed]

FINDINGS: The heart size and mediastinal contours are within normal limits.
Both lungs are clear. The visualized skeletal structures are
unremarkable.
IMPRESSION: No active disease.

## 2022-11-15 ENCOUNTER — Inpatient Hospital Stay: Payer: No Typology Code available for payment source

## 2022-11-15 ENCOUNTER — Emergency Department: Payer: No Typology Code available for payment source

## 2022-11-15 ENCOUNTER — Other Ambulatory Visit: Payer: Self-pay

## 2022-11-15 ENCOUNTER — Observation Stay
Admission: EM | Admit: 2022-11-15 | Discharge: 2022-11-17 | Disposition: A | Discharge status: Home / Self Care | Payer: No Typology Code available for payment source | Attending: Student | Admitting: Student

## 2022-11-15 DIAGNOSIS — Z7982 Long term (current) use of aspirin: Secondary | ICD-10-CM | POA: Diagnosis not present

## 2022-11-15 DIAGNOSIS — G40909 Epilepsy, unspecified, not intractable, without status epilepticus: Secondary | ICD-10-CM

## 2022-11-15 DIAGNOSIS — R531 Weakness: Secondary | ICD-10-CM

## 2022-11-15 DIAGNOSIS — I10 Essential (primary) hypertension: Secondary | ICD-10-CM | POA: Diagnosis not present

## 2022-11-15 DIAGNOSIS — N390 Urinary tract infection, site not specified: Secondary | ICD-10-CM | POA: Diagnosis not present

## 2022-11-15 DIAGNOSIS — I160 Hypertensive urgency: Secondary | ICD-10-CM | POA: Diagnosis not present

## 2022-11-15 DIAGNOSIS — M6281 Muscle weakness (generalized): Secondary | ICD-10-CM | POA: Diagnosis not present

## 2022-11-15 DIAGNOSIS — Z8546 Personal history of malignant neoplasm of prostate: Secondary | ICD-10-CM | POA: Diagnosis not present

## 2022-11-15 DIAGNOSIS — Z79899 Other long term (current) drug therapy: Secondary | ICD-10-CM | POA: Insufficient documentation

## 2022-11-15 DIAGNOSIS — R001 Bradycardia, unspecified: Secondary | ICD-10-CM | POA: Insufficient documentation

## 2022-11-15 DIAGNOSIS — E559 Vitamin D deficiency, unspecified: Secondary | ICD-10-CM | POA: Diagnosis not present

## 2022-11-15 DIAGNOSIS — R296 Repeated falls: Secondary | ICD-10-CM | POA: Insufficient documentation

## 2022-11-15 DIAGNOSIS — R2681 Unsteadiness on feet: Secondary | ICD-10-CM | POA: Insufficient documentation

## 2022-11-15 DIAGNOSIS — F1010 Alcohol abuse, uncomplicated: Secondary | ICD-10-CM

## 2022-11-15 DIAGNOSIS — E785 Hyperlipidemia, unspecified: Secondary | ICD-10-CM | POA: Diagnosis present

## 2022-11-15 DIAGNOSIS — F1090 Alcohol use, unspecified, uncomplicated: Secondary | ICD-10-CM

## 2022-11-15 LAB — URINALYSIS, W/ REFLEX TO CULTURE (INFECTION SUSPECTED)
Glucose, UA: NEGATIVE mg/dL
Hgb urine dipstick: NEGATIVE
Ketones, ur: 5 mg/dL — AB
Nitrite: NEGATIVE
Protein, ur: NEGATIVE mg/dL
Specific Gravity, Urine: 1.017 (ref 1.005–1.030)
pH: 6 (ref 5.0–8.0)

## 2022-11-15 LAB — CBC WITH DIFFERENTIAL/PLATELET
Abs Immature Granulocytes: 0.03 10*3/uL (ref 0.00–0.07)
Basophils Absolute: 0.1 10*3/uL (ref 0.0–0.1)
Basophils Relative: 1 %
Eosinophils Absolute: 0.1 10*3/uL (ref 0.0–0.5)
Eosinophils Relative: 1 %
HCT: 40.5 % (ref 39.0–52.0)
Hemoglobin: 13.6 g/dL (ref 13.0–17.0)
Immature Granulocytes: 0 %
Lymphocytes Relative: 23 %
Lymphs Abs: 1.8 10*3/uL (ref 0.7–4.0)
MCH: 31.9 pg (ref 26.0–34.0)
MCHC: 33.6 g/dL (ref 30.0–36.0)
MCV: 94.8 fL (ref 80.0–100.0)
Monocytes Absolute: 0.8 10*3/uL (ref 0.1–1.0)
Monocytes Relative: 11 %
Neutro Abs: 4.9 10*3/uL (ref 1.7–7.7)
Neutrophils Relative %: 64 %
Platelets: 210 10*3/uL (ref 150–400)
RBC: 4.27 MIL/uL (ref 4.22–5.81)
RDW: 13 % (ref 11.5–15.5)
WBC: 7.7 10*3/uL (ref 4.0–10.5)
nRBC: 0.3 % — ABNORMAL HIGH (ref 0.0–0.2)

## 2022-11-15 LAB — LACTIC ACID, PLASMA
Lactic Acid, Venous: 1.9 mmol/L (ref 0.5–1.9)
Lactic Acid, Venous: 1.8 mmol/L (ref 0.5–1.9)

## 2022-11-15 LAB — TROPONIN I (HIGH SENSITIVITY)
Troponin I (High Sensitivity): 9 ng/L (ref <18)
Troponin I (High Sensitivity): 8 ng/L (ref <18)

## 2022-11-15 LAB — COMPREHENSIVE METABOLIC PANEL
ALT: 12 U/L (ref 0–44)
AST: 24 U/L (ref 15–41)
Albumin: 3.7 g/dL (ref 3.5–5.0)
Alkaline Phosphatase: 73 U/L (ref 38–126)
Anion gap: 10 (ref 5–15)
BUN: 12 mg/dL (ref 8–23)
CO2: 26 mmol/L (ref 22–32)
Calcium: 8.6 mg/dL — ABNORMAL LOW (ref 8.9–10.3)
Chloride: 96 mmol/L — ABNORMAL LOW (ref 98–111)
Creatinine, Ser: 1.1 mg/dL (ref 0.61–1.24)
GFR, Estimated: >60 mL/min (ref >60)
Glucose, Bld: 84 mg/dL (ref 70–99)
Potassium: 4.2 mmol/L (ref 3.5–5.1)
Sodium: 132 mmol/L — ABNORMAL LOW (ref 135–145)
Total Bilirubin: 1.6 mg/dL — ABNORMAL HIGH (ref 0.3–1.2)
Total Protein: 7.2 g/dL (ref 6.5–8.1)

## 2022-11-15 LAB — URINE DRUG SCREEN, QUALITATIVE (ARMC ONLY)
Amphetamines, Ur Screen: NOT DETECTED
Barbiturates, Ur Screen: NOT DETECTED
Benzodiazepine, Ur Scrn: NOT DETECTED
Cannabinoid 50 Ng, Ur ~~LOC~~: NOT DETECTED
Cocaine Metabolite,Ur ~~LOC~~: NOT DETECTED
MDMA (Ecstasy)Ur Screen: NOT DETECTED
Methadone Scn, Ur: NOT DETECTED
Opiate, Ur Screen: NOT DETECTED
Phencyclidine (PCP) Ur S: NOT DETECTED
Tricyclic, Ur Screen: NOT DETECTED

## 2022-11-15 LAB — ETHANOL: Alcohol, Ethyl (B): <10 mg/dL (ref <10)

## 2022-11-15 LAB — CK: Total CK: 140 U/L (ref 49–397)

## 2022-11-15 MED ORDER — LORAZEPAM 0.5 MG PO TABS: 0.5000 mg | ORAL_TABLET | ORAL | Status: DC | PRN

## 2022-11-15 MED ORDER — FOLIC ACID 1 MG PO TABS
1.0000 mg | ORAL_TABLET | Freq: Every day | ORAL | Status: DC
  Administered 2022-11-15 – 2022-11-17 (×3): 1 mg via ORAL
  Filled 2022-11-15 (×3): qty 1

## 2022-11-15 MED ORDER — SODIUM CHLORIDE 0.9 % IV SOLN
1.0000 g | INTRAVENOUS | Status: DC
  Administered 2022-11-16: 1 g via INTRAVENOUS
  Filled 2022-11-15: qty 10

## 2022-11-15 MED ORDER — LORAZEPAM 1 MG PO TABS
1.0000 mg | ORAL_TABLET | ORAL | Status: DC | PRN
  Administered 2022-11-16 (×2): 1 mg via ORAL
  Filled 2022-11-15: qty 2
  Filled 2022-11-15: qty 1

## 2022-11-15 MED ORDER — ENOXAPARIN SODIUM 40 MG/0.4ML IJ SOSY
40.0000 mg | PREFILLED_SYRINGE | INTRAMUSCULAR | Status: DC
  Administered 2022-11-15 – 2022-11-16 (×2): 40 mg via SUBCUTANEOUS
  Filled 2022-11-15 (×2): qty 0.4

## 2022-11-15 MED ORDER — THIAMINE MONONITRATE 100 MG PO TABS
100.0000 mg | ORAL_TABLET | Freq: Every day | ORAL | Status: DC
  Administered 2022-11-15 – 2022-11-17 (×3): 100 mg via ORAL
  Filled 2022-11-15 (×3): qty 1

## 2022-11-15 MED ORDER — ONDANSETRON HCL 4 MG/2ML IJ SOLN: 4.0000 mg | Freq: Four times a day (QID) | INTRAMUSCULAR | Status: DC | PRN

## 2022-11-15 MED ORDER — LEVETIRACETAM 500 MG PO TABS
500.0000 mg | ORAL_TABLET | Freq: Two times a day (BID) | ORAL | Status: DC
  Administered 2022-11-15 – 2022-11-16 (×2): 500 mg via ORAL
  Filled 2022-11-15 (×2): qty 1

## 2022-11-15 MED ORDER — THIAMINE HCL 100 MG/ML IJ SOLN: 100.0000 mg | Freq: Every day | INTRAMUSCULAR | Status: DC

## 2022-11-15 MED ORDER — PANTOPRAZOLE SODIUM 40 MG PO TBEC
40.0000 mg | DELAYED_RELEASE_TABLET | Freq: Every day | ORAL | Status: DC
  Administered 2022-11-16 – 2022-11-17 (×2): 40 mg via ORAL
  Filled 2022-11-15 (×2): qty 1

## 2022-11-15 MED ORDER — SODIUM CHLORIDE 0.9 % IV SOLN: INTRAVENOUS | Status: DC

## 2022-11-15 MED ORDER — ADULT MULTIVITAMIN W/MINERALS CH
1.0000 | ORAL_TABLET | Freq: Every day | ORAL | Status: DC
  Administered 2022-11-15 – 2022-11-17 (×3): 1 via ORAL
  Filled 2022-11-15 (×3): qty 1

## 2022-11-15 MED ORDER — ASPIRIN 81 MG PO TBEC
81.0000 mg | DELAYED_RELEASE_TABLET | Freq: Every day | ORAL | Status: DC
  Administered 2022-11-16 – 2022-11-17 (×2): 81 mg via ORAL
  Filled 2022-11-15 (×2): qty 1

## 2022-11-15 MED ORDER — SODIUM CHLORIDE 0.9 % IV SOLN
1.0000 g | Freq: Once | INTRAVENOUS | Status: AC
  Administered 2022-11-15: 1 g via INTRAVENOUS
  Filled 2022-11-15: qty 10

## 2022-11-15 MED ORDER — ONDANSETRON HCL 4 MG PO TABS: 4.0000 mg | ORAL_TABLET | Freq: Four times a day (QID) | ORAL | Status: DC | PRN

## 2022-11-15 NOTE — ED Triage Notes (Signed)
Pt arrived via [facility] from home with c/o of weakness. Upon assessment EMS learned that pt had been diagnosed with a UTI and prescribed amoxicillin on 10/16/22 but 12 of 20 pills were still in the bottle. Pt c/o weakness for 1 wk. Has hx of HTM, prior stroke and UTIs.

## 2022-11-15 NOTE — H&P (Signed)
History and Physical    Patient: [name]:[id number] DOB: January 30, 1941 DOA: 11/15/2022 DOS: the patient was seen and examined on 11/15/2022 PCP: Pcp, No  Patient coming from: Home  Chief Complaint:  Chief Complaint  Patient presents with   Urinary Tract Infection   Weakness   HPI: [name] is a 82 y.o. male with medical history significant of hyperlipidemia, hypertension, alcohol abuse, seizure disorder presenting with weakness, UTI.  Per report, patient with worsening weakness over multiple weeks.  Was diagnosed with a UTI within the past month or so.  Was prescribed course of amoxicillin to which patient only took a partial dose.  Patient denies any chest pain, shortness of breath, abdominal pain.  Positive mild dysuria.  No focal hemiparesis or confusion.  Patient unclear as to what medication he was prescribed for the UTI.  Also 3+ beer drinker daily.  No reported illicit drug use.  Denies any liquor or wine use.  No orthopnea, nausea or vomiting. Presented to the ER afebrile, hemodynamically stable.  Satting well on room air.  White count 7.7, hemoglobin 14, platelets 210, creatinine 1.1 urinalysis indicative of infection.  Chest x-ray today stable. Review of Systems: As mentioned in the history of present illness. All other systems reviewed and are negative. Past Medical History:  Diagnosis Date   Acute kidney injury (HCC)    a. 07/2013 in setting of C diff;  b. 02/2016.   C. difficile colitis    a. 07/2013.   EtOH dependence (HCC)    a. 3 cans of beer/day.   Hyperlipemia    Hypertension    Phimosis    a. 09/2014 s/p circumcision.   Prostate cancer (HCC)    a. 2015 s/p radiation.   RBBB    Seizures (HCC)    Solitary kidney    [name].   Past Surgical History:  Procedure Laterality Date   Abdominal Surgery & Left Nephrectomy     a. 1968 removal of shrapnel and L nephrectomy.   Social History:  reports that he has never smoked. He has never used  smokeless tobacco. He reports current alcohol use of about 21.0 standard drinks of alcohol per week. He reports that he does not use drugs.  No Known Allergies  History reviewed. No pertinent family history.  Prior to Admission medications   Medication Sig Start Date End Date Taking? Authorizing Provider  atorvastatin (LIPITOR) 80 MG tablet Take 40 mg by mouth at bedtime. 03/03/22 03/04/23 Yes [provider]  aspirin EC 81 MG tablet Take 81 mg by mouth daily.    [provider]  chlordiazePOXIDE (LIBRIUM) 25 MG capsule Take 1 tablet 5 times a day on the first day, then decrease by 1 tablet daily until gone Patient not taking: Reported on 11/15/2022 03/17/18   Emily Filbert, MD  FLUoxetine (PROZAC) 20 MG capsule Take 40 mg by mouth every morning.    [provider]  levETIRAcetam (KEPPRA) 500 MG tablet Take 1 tablet (500 mg total) by mouth 2 (two) times daily. 03/17/18   Emily Filbert, MD  pantoprazole (PROTONIX) 40 MG tablet Take 40 mg by mouth every morning.    [provider]  tamsulosin (FLOMAX) 0.4 MG CAPS capsule Take 0.4 mg by mouth daily after supper. *Take 30 minutes after evening meal*    [provider]  traZODone (DESYREL) 100 MG tablet Take 200 mg by mouth at bedtime.     [provider]  Physical Exam: Vitals:   11/15/22 1030 11/15/22 1100 11/15/22 1130 11/15/22 1200  BP: (!) 155/67 (!) 154/70 (!) 175/87 (!) 166/71  Pulse: (!) 52 (!) 51 (!) 46 (!) 49  Resp: 18 17 14  (!) 21  Temp:      TempSrc:      SpO2: 100% 100% 100% 99%  Weight:      Height:       Physical Exam Constitutional:      Appearance: He is normal weight.  HENT:     Head: Normocephalic.     Nose: Nose normal.     Mouth/Throat:     Mouth: Mucous membranes are dry.  Eyes:     Pupils: Pupils are equal, round, and reactive to light.  Cardiovascular:     Rate and Rhythm: Normal rate and regular rhythm.  Pulmonary:     Effort: Pulmonary  effort is normal.  Abdominal:     General: Bowel sounds are normal.  Musculoskeletal:        General: Normal range of motion.     Cervical back: Normal range of motion.  Skin:    General: Skin is dry.  Neurological:     General: No focal deficit present.  Psychiatric:        Mood and Affect: Mood normal.     Data Reviewed:  There are no new results to review at this time. DG Chest Port 1 View CLINICAL DATA:  Weakness.  EXAM: PORTABLE CHEST 1 VIEW  COMPARISON:  Chest x-ray Sep 30, 2018.  FINDINGS: The heart size and mediastinal contours are within normal limits. Both lungs are clear. No visible pleural effusions or pneumothorax. No acute osseous abnormality.  IMPRESSION: No active disease.  Electronically Signed   By: Feliberto Harts M.D.   On: 11/15/2022 10:26  Lab Results  Component Value Date   WBC 7.7 11/15/2022   HGB 13.6 11/15/2022   HCT 40.5 11/15/2022   MCV 94.8 11/15/2022   PLT 210 11/15/2022   Last metabolic panel Lab Results  Component Value Date   GLUCOSE 84 11/15/2022   NA 132 (L) 11/15/2022   K 4.2 11/15/2022   CL 96 (L) 11/15/2022   CO2 26 11/15/2022   BUN 12 11/15/2022   CREATININE 1.10 11/15/2022   GFRNONAA >60 11/15/2022   CALCIUM 8.6 (L) 11/15/2022   PHOS 3.1 03/09/2016   PROT 7.2 11/15/2022   ALBUMIN 3.7 11/15/2022   BILITOT 1.6 (H) 11/15/2022   ALKPHOS 73 11/15/2022   AST 24 11/15/2022   ALT 12 11/15/2022   ANIONGAP 10 11/15/2022    Assessment and Plan: * UTI (urinary tract infection) Recurrent dysuria times roughly 1 month in the setting of possible dosing of outpatient antibiotic regimen Urinalysis indicative of infection IV Rocephin Urine culture Follow  Weakness Noted progressive generalized weakness over several weeks per report Suspect multifactorial in the setting of UTI, alcohol use, dehydration Suspect element of subclinical dementia Treat UTI IV fluid hydration CK level  CT head x 1  CIWA protocol PT  OT evaluation Otherwise follow-up closely  ETOH abuse Patient reports drinking at least 2-3 large beers daily Does not appear to be in active withdrawals at present Prophylactic CIWA protocol Check ETOH level and UDS  Follow  Hypertension BP stable  Titrate home regimen    Hyperlipemia Hold statin pending CK level in setting of weakness       Advance Care Planning:   Code Status: Full Code   Consults: None  Family Communication: No family at the bedside   Severity of Illness: The appropriate patient status for this patient is INPATIENT. Inpatient status is judged to be reasonable and necessary in order to provide the required intensity of service to ensure the patient's safety. The patient's presenting symptoms, physical exam findings, and initial radiographic and laboratory data in the context of their chronic comorbidities is felt to place them at high risk for further clinical deterioration. Furthermore, it is not anticipated that the patient will be medically stable for discharge from the hospital within 2 midnights of admission.   * I certify that at the point of admission it is my clinical judgment that the patient will require inpatient hospital care spanning beyond 2 midnights from the point of admission due to high intensity of service, high risk for further deterioration and high frequency of surveillance required.*  Author: Floydene Flock, MD 11/15/2022 2:30 PM  For on call review [email or web address].

## 2022-11-15 NOTE — ED Provider Notes (Signed)
[facility] Provider Note    Event Date/Time   First MD Initiated Contact with Patient 11/15/22 [phone]     (approximate)   History   Urinary Tract Infection and Weakness   HPI  [name] is a 82 y.o. male   Past medical history of prostate cancer, CVA, alcohol use, presents to the emergency department with generalized weakness fatigue over the last several weeks.  With family who encouraged him to come to the emergency department for reevaluation today.  Has been diagnosed with UTI but did not finish his antibiotic course, noted to have 12/20 amoxicillin tablets remaining.  Denies any focal complaints like chest pain, abdominal pain, nausea vomiting or diarrhea, GI or GU complaints, respiratory infectious symptoms.   External Medical Documents Reviewed: Discharge summary from May 2020 with UTI and AKI      Physical Exam   Triage Vital Signs: ED Triage Vitals  Enc Vitals Group     BP 11/15/22 0958 (!) 149/112     Pulse Rate 11/15/22 0958 (!) 51     Resp 11/15/22 0958 13     Temp 11/15/22 0958 99.1 F (37.3 C)     Temp Source 11/15/22 0958 Oral     SpO2 11/15/22 0958 100 %     Weight 11/15/22 1000 150 lb (68 kg)     Height 11/15/22 1000 5\' 8"  (1.727 m)     Head Circumference --      Peak Flow --      Pain Score 11/15/22 0959 0     Pain Loc --      Pain Edu? --      Excl. in GC? --     Most recent vital signs: Vitals:   11/15/22 0958  BP: (!) 149/112  Pulse: (!) 51  Resp: 13  Temp: 99.1 F (37.3 C)  SpO2: 100%    General: Awake, no distress.  CV:  Good peripheral perfusion.  Resp:  Normal effort.  Abd:  No distention.  Other:  Awake alert comfortable nontoxic, hypertensive otherwise vital signs within normal limits afebrile.  Soft nontender abdomen appears euvolemic.   ED Results / Procedures / Treatments   Labs (all labs ordered are listed, but only abnormal results are displayed) Labs Reviewed  URINALYSIS, W/ REFLEX  TO CULTURE (INFECTION SUSPECTED) - Abnormal; Notable for the following components:      Result Value   Color, Urine YELLOW (*)    APPearance CLEAR (*)    Bilirubin Urine SMALL (*)    Ketones, ur 5 (*)    Leukocytes,Ua SMALL (*)    Bacteria, UA RARE (*)    All other components within normal limits  URINE CULTURE  COMPREHENSIVE METABOLIC PANEL  LACTIC ACID, PLASMA  LACTIC ACID, PLASMA  CBC WITH DIFFERENTIAL/PLATELET  TROPONIN I (HIGH SENSITIVITY)  TROPONIN I (HIGH SENSITIVITY)     I ordered and reviewed the above labs they are notable for bacteria and leukocytes in the urine and white blood cell count 11.6  EKG  ED ECG REPORT I, Pilar Jarvis, the attending physician, personally viewed and interpreted this ECG.   Date: 11/15/2022  EKG Time: 0957  Rate: 53  Rhythm: sinus  Intervals:rbbb  ST&T Change: no stemi    RADIOLOGY I independently reviewed and interpreted chest x-ray and I see no obvious focality pneumothorax   PROCEDURES:  Critical Care performed: No  Procedures   MEDICATIONS ORDERED IN ED: Medications  cefTRIAXone (ROCEPHIN) 1 g in sodium  chloride 0.9 % 100 mL IVPB (has no administration in time range)    External physician / consultants:  I spoke with hospitalist for admission and regarding care plan for this patient.   IMPRESSION / MDM / ASSESSMENT AND PLAN / ED COURSE  I reviewed the triage vital signs and the nursing notes.                                Patient's presentation is most consistent with acute presentation with potential threat to life or bodily function.  Differential diagnosis includes, but is not limited to, UTI, electrolyte disturbance, AKI, CVA, ACS, respiratory infection   The patient is on the cardiac monitor to evaluate for evidence of arrhythmia and/or significant heart rate changes.  MDM: This patient with generalized weakness recently diagnosed UTI but did not complete treatment course here with generalized  weakness/fatigue no focal pain or infectious symptoms otherwise.  He has bacteria in the urine and leukocytes concerning for UTI.  Treat with Rocephin IV.  Lives at home with family, I am unable to reach them, however the patient states very weak, came with soiled underpants, stating that he was too weak to make it to the bathroom.  Will admit for UTI, generalized weakness, IV antibiotics.         FINAL CLINICAL IMPRESSION(S) / ED DIAGNOSES   Final diagnoses:  Lower urinary tract infectious disease     Rx / DC Orders   ED Discharge Orders     None        Note:  This document was prepared using Dragon voice recognition software and may include unintentional dictation errors.    Pilar Jarvis, MD 11/15/22 1149

## 2022-11-15 NOTE — Assessment & Plan Note (Signed)
Hold statin pending CK level in setting of weakness

## 2022-11-15 NOTE — Assessment & Plan Note (Signed)
BP stable Titrate home regimen 

## 2022-11-15 NOTE — Assessment & Plan Note (Addendum)
Patient reports drinking at least 2-3 large beers daily Does not appear to be in active withdrawals at present Prophylactic CIWA protocol Check ETOH level and UDS  Follow

## 2022-11-15 NOTE — ED Notes (Signed)
Pt given a box lunch and water. Tolerated well.

## 2022-11-15 NOTE — Assessment & Plan Note (Signed)
Recurrent dysuria times roughly 1 month in the setting of possible dosing of outpatient antibiotic regimen Urinalysis indicative of infection IV Rocephin Urine culture Follow

## 2022-11-15 NOTE — ED Notes (Signed)
Pt was going to give a urine sample and this RN assisted pt to remove the side of the bed when it was determined by pt and RN that pt was too weak to try to stand at the bedside and use the urinal. This RN helped pt to remove their pants, boxer shorts, 3 depends that were heavily soiled with urine and feces. Pt was able to use the urinal in the bed. Pericare was performed, a new brief, and chuck pad were placed and pt is clean and dry at this time.

## 2022-11-15 NOTE — Assessment & Plan Note (Addendum)
Noted progressive generalized weakness over several weeks per report Suspect multifactorial in the setting of UTI, alcohol use, dehydration Suspect element of subclinical dementia Treat UTI IV fluid hydration CK level  CT head x 1  CIWA protocol PT OT evaluation Otherwise follow-up closely

## 2022-11-15 NOTE — Assessment & Plan Note (Signed)
Continue home Keppra. 

## 2022-11-16 ENCOUNTER — Observation Stay: Payer: No Typology Code available for payment source

## 2022-11-16 ENCOUNTER — Observation Stay (HOSPITAL_BASED_OUTPATIENT_CLINIC_OR_DEPARTMENT_OTHER)
Admit: 2022-11-16 | Discharge: 2022-11-16 | Disposition: A | Discharge status: Home / Self Care | Payer: No Typology Code available for payment source | Attending: Student | Admitting: Student

## 2022-11-16 DIAGNOSIS — I160 Hypertensive urgency: Secondary | ICD-10-CM | POA: Diagnosis not present

## 2022-11-16 DIAGNOSIS — N390 Urinary tract infection, site not specified: Secondary | ICD-10-CM | POA: Diagnosis not present

## 2022-11-16 DIAGNOSIS — I421 Obstructive hypertrophic cardiomyopathy: Secondary | ICD-10-CM

## 2022-11-16 DIAGNOSIS — R001 Bradycardia, unspecified: Secondary | ICD-10-CM

## 2022-11-16 LAB — COMPREHENSIVE METABOLIC PANEL
ALT: 12 U/L (ref 0–44)
AST: 20 U/L (ref 15–41)
Albumin: 3.1 g/dL — ABNORMAL LOW (ref 3.5–5.0)
Alkaline Phosphatase: 66 U/L (ref 38–126)
Anion gap: 9 (ref 5–15)
BUN: 9 mg/dL (ref 8–23)
CO2: 24 mmol/L (ref 22–32)
Calcium: 8.6 mg/dL — ABNORMAL LOW (ref 8.9–10.3)
Chloride: 101 mmol/L (ref 98–111)
Creatinine, Ser: 0.96 mg/dL (ref 0.61–1.24)
GFR, Estimated: >60 mL/min (ref >60)
Glucose, Bld: 83 mg/dL (ref 70–99)
Potassium: 3.8 mmol/L (ref 3.5–5.1)
Sodium: 134 mmol/L — ABNORMAL LOW (ref 135–145)
Total Bilirubin: 1.1 mg/dL (ref 0.3–1.2)
Total Protein: 6.4 g/dL — ABNORMAL LOW (ref 6.5–8.1)

## 2022-11-16 LAB — ECHOCARDIOGRAM COMPLETE
AR max vel: 2.1 cm2
AV Area VTI: 2 cm2
AV Area mean vel: 2.01 cm2
AV Mean grad: 3 mmHg
AV Peak grad: 6 mmHg
Ao pk vel: 1.22 m/s
Area-P 1/2: 3.39 cm2
Height: 68 in
S' Lateral: 2.8 cm
Weight: 2400 oz

## 2022-11-16 LAB — CBC
HCT: 41.4 % (ref 39.0–52.0)
Hemoglobin: 13.6 g/dL (ref 13.0–17.0)
MCH: 31.9 pg (ref 26.0–34.0)
MCHC: 32.9 g/dL (ref 30.0–36.0)
MCV: 97.2 fL (ref 80.0–100.0)
Platelets: 147 10*3/uL — ABNORMAL LOW (ref 150–400)
RBC: 4.26 MIL/uL (ref 4.22–5.81)
RDW: 13.1 % (ref 11.5–15.5)
WBC: 5.7 10*3/uL (ref 4.0–10.5)
nRBC: 0 % (ref 0.0–0.2)

## 2022-11-16 LAB — URINE CULTURE: Culture: NO GROWTH

## 2022-11-16 LAB — PHOSPHORUS: Phosphorus: 3.6 mg/dL (ref 2.5–4.6)

## 2022-11-16 LAB — MAGNESIUM: Magnesium: 1.9 mg/dL (ref 1.7–2.4)

## 2022-11-16 LAB — VITAMIN D 25 HYDROXY (VIT D DEFICIENCY, FRACTURES): Vit D, 25-Hydroxy: 10.15 ng/mL — ABNORMAL LOW (ref 30–100)

## 2022-11-16 LAB — LIPID PANEL
Cholesterol: 195 mg/dL (ref 0–200)
HDL: 66 mg/dL (ref >40)
LDL Cholesterol: 116 mg/dL — ABNORMAL HIGH (ref 0–99)
Total CHOL/HDL Ratio: 3 RATIO
Triglycerides: 65 mg/dL (ref <150)
VLDL: 13 mg/dL (ref 0–40)

## 2022-11-16 LAB — TSH: TSH: 1.988 u[IU]/mL (ref 0.350–4.500)

## 2022-11-16 MED ORDER — HYDRALAZINE HCL 50 MG PO TABS: 50.0000 mg | ORAL_TABLET | Freq: Four times a day (QID) | ORAL | Status: DC | PRN

## 2022-11-16 MED ORDER — ACETAMINOPHEN 325 MG PO TABS: 650.0000 mg | ORAL_TABLET | Freq: Four times a day (QID) | ORAL | Status: DC | PRN

## 2022-11-16 MED ORDER — VITAMIN D (ERGOCALCIFEROL) 1.25 MG (50000 UNIT) PO CAPS
50000.0000 [IU] | ORAL_CAPSULE | ORAL | Status: DC
  Administered 2022-11-16: 50000 [IU] via ORAL
  Filled 2022-11-16: qty 1

## 2022-11-16 MED ORDER — HYDRALAZINE HCL 20 MG/ML IJ SOLN: 10.0000 mg | Freq: Four times a day (QID) | INTRAMUSCULAR | Status: DC | PRN

## 2022-11-16 MED ORDER — TAMSULOSIN HCL 0.4 MG PO CAPS: 0.8000 mg | ORAL_CAPSULE | Freq: Every day | ORAL | Status: DC

## 2022-11-16 MED ORDER — AMLODIPINE BESYLATE 10 MG PO TABS
10.0000 mg | ORAL_TABLET | Freq: Every day | ORAL | Status: DC
  Administered 2022-11-16 – 2022-11-17 (×2): 10 mg via ORAL
  Filled 2022-11-16 (×2): qty 1

## 2022-11-16 MED ORDER — LISINOPRIL 5 MG PO TABS
5.0000 mg | ORAL_TABLET | Freq: Every day | ORAL | Status: DC
  Administered 2022-11-16 – 2022-11-17 (×2): 5 mg via ORAL
  Filled 2022-11-16 (×2): qty 1

## 2022-11-16 MED ORDER — SPIRITUS FRUMENTI
1.0000 | Freq: Three times a day (TID) | ORAL | Status: DC
  Administered 2022-11-16 – 2022-11-17 (×2): 1 via ORAL
  Filled 2022-11-16 (×4): qty 1

## 2022-11-16 NOTE — TOC Progression Note (Signed)
Transition of Care [facility]) - Progression Note    Patient Details  Name: [name] MRN: [id number] Date of Birth: [birth year]  Transition of [facility]) CM/SW Contact  Allena Katz, LCSW Phone Number: 11/16/2022, 3:55 PM  Clinical Narrative:   CSW attempted to deliver code 64 however pt is disoriented. CSW tried to contact son and wife but unable to reach.         Expected Discharge Plan and Services                                               Social Determinants of Health (SDOH) Interventions SDOH Screenings   Tobacco Use: Low Risk  (11/15/2022)    Readmission Risk Interventions     No data to display

## 2022-11-16 NOTE — Evaluation (Addendum)
Physical Therapy Evaluation Patient Details Name: [name] MRN: [id number] DOB: 1940-06-09 Today's Date: 11/16/2022  History of Present Illness  Patient is a 82 year old male with presenting with weakness worsening over multiple weeks, UTI.  Clinical Impression  Patient is agreeable to PT. He is cooperative with assessment. He is disoriented to situation. He reports he is independent with ambulation and uses a cane most of the time. He lives with his spouse.  Patient is mildly tremulous with activity. He was able to stand with Min guard assistance and ambulated in hallway with Min guard assistance with use of IV pole. No shortness of breath is noted with mobility. Patient is requesting to go home. PT will continue to follow to maximize independence and facilitate return to prior level of function.      Recommendations for follow up therapy are one component of a multi-disciplinary discharge planning process, led by the attending physician.  Recommendations may be updated based on patient status, additional functional criteria and insurance authorization.  Follow Up Recommendations       Assistance Recommended at Discharge Set up Supervision/Assistance  Patient can return home with the following  Help with stairs or ramp for entrance;Assist for transportation    Equipment Recommendations None recommended by PT  Recommendations for Other Services       Functional Status Assessment Patient has had a recent decline in their functional status and demonstrates the ability to make significant improvements in function in a reasonable and predictable amount of time.     Precautions / Restrictions Precautions Precautions: Fall Restrictions Weight Bearing Restrictions: No      Mobility  Bed Mobility               General bed mobility comments: not assessed as patient sitting up on edge of bed on arrival to room and post session    Transfers Overall transfer level: Needs  assistance Equipment used: None Transfers: Sit to/from Stand Sit to Stand: Min guard           General transfer comment: Min guard for safety    Ambulation/Gait Ambulation/Gait assistance: Min guard Gait Distance (Feet): 120 Feet Assistive device: IV Pole Gait Pattern/deviations: Wide base of support Gait velocity: decreased     General Gait Details: patient mildly tremulous with ambulation. no gross loss of balance with using IV pole for support. no shortness of breath noted with activity. unable to get acurate pleth for Sp02  Stairs            Wheelchair Mobility    Modified Rankin (Stroke Patients Only)       Balance Overall balance assessment: Needs assistance Sitting-balance support: Feet supported Sitting balance-Leahy Scale: Fair     Standing balance support: Single extremity supported Standing balance-Leahy Scale: Fair                               Pertinent Vitals/Pain Pain Assessment Pain Assessment: No/denies pain    Home Living Family/patient expects to be discharged to:: Private residence Living Arrangements: Spouse/significant other Available Help at Discharge: Family Type of Home: House Home Access: Stairs to enter   Secretary/administrator of Steps: 5-6   Home Layout: One level Home Equipment: Agricultural consultant (2 wheels);Cane - single point Additional Comments: patient does not drive    Prior Function Prior Level of Function : Independent/Modified Independent  Mobility Comments: using cane mostly for ambulation ADLs Comments: independent     Hand Dominance        Extremity/Trunk Assessment   Upper Extremity Assessment Upper Extremity Assessment: Overall WFL for tasks assessed (tremors noted)    Lower Extremity Assessment Lower Extremity Assessment: Overall WFL for tasks assessed       Communication   Communication: No difficulties  Cognition Arousal/Alertness: Awake/alert Behavior During  Therapy: WFL for tasks assessed/performed Overall Cognitive Status: No family/caregiver present to determine baseline cognitive functioning                                 General Comments: patient is disoriented to situation. he was able to follow single step commands consistently        General Comments      Exercises     Assessment/Plan    PT Assessment Patient needs continued PT services  PT Problem List Decreased activity tolerance;Decreased balance;Decreased mobility;Decreased safety awareness       PT Treatment Interventions DME instruction;Gait training;Stair training;Functional mobility training;Therapeutic activities;Therapeutic exercise;Neuromuscular re-education;Balance training;Cognitive remediation;Patient/family education    PT Goals (Current goals can be found in the Care Plan section)  Acute Rehab PT Goals Patient Stated Goal: to go home PT Goal Formulation: With patient Time For Goal Achievement: 11/30/22 Potential to Achieve Goals: Good    Frequency Min 3X/week     Co-evaluation               AM-PAC PT "6 Clicks" Mobility  Outcome Measure Help needed turning from your back to your side while in a flat bed without using bedrails?: None Help needed moving from lying on your back to sitting on the side of a flat bed without using bedrails?: A Little Help needed moving to and from a bed to a chair (including a wheelchair)?: A Little Help needed standing up from a chair using your arms (e.g., wheelchair or bedside chair)?: A Little Help needed to walk in hospital room?: A Little Help needed climbing 3-5 steps with a railing? : A Little 6 Click Score: 19    End of Session   Activity Tolerance: Patient tolerated treatment well Patient left: in bed;with call bell/phone within reach;with bed alarm set Nurse Communication: Mobility status PT Visit Diagnosis: Muscle weakness (generalized) (M62.81);Unsteadiness on feet (R26.81)    Time:  1016-1030 PT Time Calculation (min) (ACUTE ONLY): 14 min   Charges:    PT Eval Low Complexity          Donna Bernard, PT, MPT   Ina Homes 11/16/2022, 1:13 PM

## 2022-11-16 NOTE — Evaluation (Signed)
Occupational Therapy Evaluation Patient Details Name: [name] MRN: [id number] DOB: [birth year] Today's Date: 11/16/2022   History of Present Illness Patient is a 82 year old male with presenting with weakness worsening over multiple weeks, UTI.   Clinical Impression   Patient presenting with decreased Ind in self care,balance, functional mobility/transfers, endurance, and safety awareness. Patient reports living at home with wife and being mod I at baseline.Upon entering the room, pt standing on EOB with bed alarm going off and urinating on the floor. IV also stretched as far as it can go. OT assisted pt with equipment and he pushed IV pole into bathroom for toileting needs. He was able to perform hygiene and clothing management with min guard. Pt's RN and NT present to assist with external cath, tele leads, and changing of bed linen. OT assisted pt with changing into clean gown and pt performs figure four position to don B socks. Pt reports drinking at least 3 beers a day. He is now displaying UE tremors and does not wish to detox in hospital. OT notified RN and MD. Patient will benefit from acute OT to increase overall independence in the areas of ADLs, functional mobility, and safety awareness in order to safely discharge.     Recommendations for follow up therapy are one component of a multi-disciplinary discharge planning process, led by the attending physician.  Recommendations may be updated based on patient status, additional functional criteria and insurance authorization.   Assistance Recommended at Discharge Frequent or constant Supervision/Assistance  Patient can return home with the following A little help with walking and/or transfers;A little help with bathing/dressing/bathroom;Assistance with cooking/housework;Assist for transportation;Help with stairs or ramp for entrance    Functional Status Assessment  Patient has had a recent decline in their functional status and  demonstrates the ability to make significant improvements in function in a reasonable and predictable amount of time.  Equipment Recommendations  None recommended by OT       Precautions / Restrictions Precautions Precautions: Fall Restrictions Weight Bearing Restrictions: No      Mobility Bed Mobility               General bed mobility comments: Pt standing on EOB with bed alarms going off as therapist enters the room    Transfers Overall transfer level: Needs assistance Equipment used: None Transfers: Sit to/from Stand Sit to Stand: Min guard           General transfer comment: Min guard for safety      Balance Overall balance assessment: Needs assistance Sitting-balance support: Feet supported Sitting balance-Leahy Scale: Fair     Standing balance support: Single extremity supported Standing balance-Leahy Scale: Fair                             ADL either performed or assessed with clinical judgement   ADL Overall ADL's : Needs assistance/impaired     Grooming: Wash/dry hands;Standing;Min Doctor, hospital: Min guard;Ambulation   Toileting- Clothing Manipulation and Hygiene: Min guard;Sit to/from stand       Functional mobility during ADLs: Min guard       Vision Patient Visual Report: No change from baseline              Pertinent Vitals/Pain Pain Assessment Pain Assessment: No/denies pain     Hand Dominance Right  Extremity/Trunk Assessment Upper Extremity Assessment Upper Extremity Assessment: Overall WFL for tasks assessed (tremors noted)   Lower Extremity Assessment Lower Extremity Assessment: Overall WFL for tasks assessed       Communication Communication Communication: No difficulties   Cognition Arousal/Alertness: Awake/alert Behavior During Therapy: Impulsive Overall Cognitive Status: No family/caregiver present to determine baseline cognitive functioning                                  General Comments: Pt needing cuing for safety awareness                Home Living Family/patient expects to be discharged to:: Private residence Living Arrangements: Spouse/significant other Available Help at Discharge: Family Type of Home: House Home Access: Stairs to enter Secretary/administrator of Steps: 5-6   Home Layout: One level               Home Equipment: Agricultural consultant (2 wheels);Cane - single point   Additional Comments: patient does not drive      Prior Functioning/Environment Prior Level of Function : Independent/Modified Independent             Mobility Comments: using cane mostly for ambulation ADLs Comments: independent        OT Problem List: Decreased strength;Decreased range of motion;Decreased activity tolerance;Decreased safety awareness;Impaired balance (sitting and/or standing);Decreased knowledge of use of DME or AE      OT Treatment/Interventions: Self-care/ADL training;Therapeutic exercise;Therapeutic activities;DME and/or AE instruction;Patient/family education;Balance training    OT Goals(Current goals can be found in the care plan section) Acute Rehab OT Goals Patient Stated Goal: to go home OT Goal Formulation: With patient Time For Goal Achievement: 11/30/22 Potential to Achieve Goals: Fair ADL Goals Pt Will Perform Grooming: with modified independence;standing Pt Will Perform Lower Body Dressing: with modified independence;sit to/from stand Pt Will Transfer to Toilet: with modified independence;ambulating Pt Will Perform Toileting - Clothing Manipulation and hygiene: with modified independence;sit to/from stand  OT Frequency: Min 2X/week       AM-PAC OT "6 Clicks" Daily Activity     Outcome Measure Help from another person eating meals?: None Help from another person taking care of personal grooming?: None Help from another person toileting, which includes using toliet, bedpan, or  urinal?: A Little Help from another person bathing (including washing, rinsing, drying)?: A Little Help from another person to put on and taking off regular upper body clothing?: None Help from another person to put on and taking off regular lower body clothing?: A Little 6 Click Score: 21   End of Session Equipment Utilized During Treatment: Rolling walker (2 wheels) Nurse Communication: Mobility status;Other (comment) (notified MD and RN that pt does not want to detox and asking for beer with meals)  Activity Tolerance: Patient tolerated treatment well Patient left: Other (comment) (seated in recliner chair)  OT Visit Diagnosis: Unsteadiness on feet (R26.81);Repeated falls (R29.6);Muscle weakness (generalized) (M62.81)                Time: 4098-1191 OT Time Calculation (min): 19 min Charges:  OT General Charges $OT Visit: 1 Visit OT Evaluation $OT Eval Moderate Complexity: 1 Mod OT Treatments $Self Care/Home Management : 8-22 mins Jackquline Denmark, MS, OTR/L , CBIS ascom [phone]  11/16/22, 2:46 PM

## 2022-11-16 NOTE — Progress Notes (Signed)
*  PRELIMINARY RESULTS* Echocardiogram 2D Echocardiogram has been performed.  Cristela Blue 11/16/2022, 1:44 PM

## 2022-11-16 NOTE — Progress Notes (Addendum)
[facility] Progress Note  Patient: [name]    BJY:[id number]  DOA: 11/15/2022     Date of Service: the patient was seen and examined on 11/16/2022  Chief Complaint  Patient presents with   Urinary Tract Infection   Weakness   Brief hospital course: [name] is a 82 y.o. male with medical history significant of hyperlipidemia, hypertension, alcohol abuse, seizure disorder presenting with weakness, UTI.  Per report, patient with worsening weakness over multiple weeks.  Was diagnosed with a UTI within the past month or so.  Was prescribed course of amoxicillin to which patient only took a partial dose.  Patient denies any chest pain, shortness of breath, abdominal pain.  Positive mild dysuria.  No focal hemiparesis or confusion.  Patient unclear as to what medication he was prescribed for the UTI.  Also 3+ beer drinker daily.  No reported illicit drug use.  Denies any liquor or wine use.  No orthopnea, nausea or vomiting. Presented to the ER afebrile, hemodynamically stable.  Satting well on room air.  White count 7.7, hemoglobin 14, platelets 210, creatinine 1.1 urinalysis indicative of infection.  Chest x-ray today stable.   Assessment and Plan:  # Generalized weakness, unknown cause Suspected UTI, urine culture negative, patient denies any dysuria S/p ceftriaxone, discontinued antibiotics for now TSH 1.9 wnl, ck 140 wnl, UDS negative, CT Head negative for any acute findings Renal US: Status post left nephrectomy. Right kidney is unremarkable. Mild prostatic enlargement. F/u balder scan to r/o urinary retention   # Hypertensive urgency and Bradycardia Continue to monitor on telemetry Resumed home dose lisinopril 5 mg p.o. daily Started amlodipine 10 mg p.o. daily Hold off propranolol Home medication due to bradycardia   # ETOH abuse Patient reports drinking at least 2-3 large beers daily Prophylactic CIWA protocol ETOH level <10 and UDS negative  Continue  multivitamin, thiamine and folate 6/20 patient was experiencing some withdrawal symptoms in the afternoon as per RN and patient was requesting beer with meals.  He does not want to quit alcohol, does not want to detox.  # HLD on statin F/u Lipid panle and resume statin when needed    # Vitamin D deficiency: started vitamin D 50,000 units p.o. weekly, follow with PCP to repeat vitamin D level after 3 to 6 months.  BPH on flomax   Body mass index is 22.81 kg/m.  Interventions:  Diet: Heart healthy diet DVT Prophylaxis: Subcutaneous Lovenox   Advance goals of care discussion: Full code  Family Communication: family was not present at bedside, at the time of interview.  The pt provided permission to discuss medical plan with the family. Opportunity was given to ask question and all questions were answered satisfactorily.   Disposition:  Pt is from Home, admitted with generalized weakness, found to have bradycardia and hypotension, still has bradycardia and high blood pressure, which precludes a safe discharge. Discharge to home, when clinically stable.  Subjective: No significant events overnight, patient presented due to generalized weakness, gradually improving, denies any chest pain or palpitation, no shortness of breath,  Physical Exam: General: NAD, lying comfortably Appear in no distress, affect appropriate Eyes: PERRLA ENT: Oral Mucosa Clear, moist  Neck: no JVD,  Cardiovascular: S1 and S2 Present, no Murmur,  Respiratory: good respiratory effort, Bilateral Air entry equal and Decreased, no Crackles, no wheezes Abdomen: Bowel Sound present, Soft and no tenderness,  Skin: no rashes Extremities: no Pedal edema, no calf tenderness Neurologic: without any new focal  findings Gait not checked due to patient safety concerns  Vitals:   11/16/22 0453 11/16/22 0842 11/16/22 1037 11/16/22 1209  BP: (!) 166/58 (!) 188/70 (!) 142/84 (!) 180/70  Pulse: (!) 52 (!) 44 (!) 47    Resp: 16 18 20 18   Temp: 98.4 F (36.9 C) 97.6 F (36.4 C)    TempSrc: Oral     SpO2: 100% 99% 100% 100%  Weight:      Height:        Intake/Output Summary (Last 24 hours) at 11/16/2022 1346 Last data filed at 11/16/2022 1205 Gross per 24 hour  Intake 410.36 ml  Output 1850 ml  Net -1439.64 ml   Filed Weights   11/15/22 1000  Weight: 68 kg    Data Reviewed: I have personally reviewed and interpreted daily labs, tele strips, imagings as discussed above. I reviewed all nursing notes, pharmacy notes, vitals, pertinent old records I have discussed plan of care as described above with RN and patient/family.  CBC: Recent Labs  Lab 11/15/22 1320 11/16/22 0635  WBC 7.7 5.7  NEUTROABS 4.9  --   HGB 13.6 13.6  HCT 40.5 41.4  MCV 94.8 97.2  PLT 210 147*   Basic Metabolic Panel: Recent Labs  Lab 11/15/22 1320 11/16/22 0635 11/16/22 0913  NA 132* 134*  --   K 4.2 3.8  --   CL 96* 101  --   CO2 26 24  --   GLUCOSE 84 83  --   BUN 12 9  --   CREATININE 1.10 0.96  --   CALCIUM 8.6* 8.6*  --   MG  --   --  1.9  PHOS  --   --  3.6    Studies: US RENAL  Result Date: 11/16/2022 CLINICAL DATA:  Urinary tract infection. Status post left nephrectomy. EXAM: RENAL / URINARY TRACT ULTRASOUND COMPLETE COMPARISON:  March 09, 2016. FINDINGS: Right Kidney: Renal measurements: 10.2 x 6.0 x 5.7 cm = volume: 181 mL. Echogenicity within normal limits. No mass or hydronephrosis visualized. Status post left nephrectomy. Bladder: Appears normal for degree of bladder distention. Right ureteral jet is visualized. Other: Mild prostatic enlargement is noted. IMPRESSION: Status post left nephrectomy. Right kidney is unremarkable. Mild prostatic enlargement. Electronically Signed   By: Lupita Raider M.D.   On: 11/16/2022 12:53   CT HEAD WO CONTRAST ( )  Result Date: 11/15/2022 CLINICAL DATA:  Mental status change, unknown cause. EXAM: CT HEAD WITHOUT CONTRAST TECHNIQUE: Contiguous axial  images were obtained from the base of the skull through the vertex without intravenous contrast. RADIATION DOSE REDUCTION: This exam was performed according to the departmental dose-optimization program which includes automated exposure control, adjustment of the mA and/or kV according to patient size and/or use of iterative reconstruction technique. COMPARISON:  Head CT 09/30/2018. FINDINGS: Brain: No acute hemorrhage. 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 Cortical gray-white differentiation is otherwise preserved. Prominence of the ventricles and sulci within expected range for age. No hydrocephalus or extra-axial collection. No mass effect or midline shift. Vascular: No hyperdense vessel or unexpected calcification. Skull: No calvarial fracture or suspicious bone lesion. Skull base is unremarkable. Sinuses/Orbits: Unremarkable. Other: None. IMPRESSION: 1. No acute intracranial abnormality. 2. Unchanged moderate chronic small-vessel disease. Electronically Signed   By: Orvan Falconer M.D.   On: 11/15/2022 15:23    Scheduled Meds:  amLODipine  10 mg Oral Daily   aspirin EC  81 mg Oral  Daily   enoxaparin (LOVENOX) injection  40 mg Subcutaneous Q24H   folic acid  1 mg Oral Daily   lisinopril  5 mg Oral Daily   multivitamin with minerals  1 tablet Oral Daily   pantoprazole  40 mg Oral Daily   tamsulosin  0.8 mg Oral QPC supper   thiamine  100 mg Oral Daily   Or   thiamine  100 mg Intravenous Daily   Continuous Infusions:  sodium chloride 100 mL/hr at 11/16/22 1254   PRN Meds: acetaminophen, LORazepam **OR** LORazepam, ondansetron **OR** ondansetron (ZOFRAN) IV  Time spent: 35 minutes  Author: Gillis Santa. MD [facility] 11/16/2022 1:46 PM  To reach On-call, see care teams to locate the attending and reach out to them via [email or web address]. If 7PM-7AM, please contact night-coverage If you still have difficulty  reaching the attending provider, please page the [facility] (Director on Call) for [facility] on amion for assistance.

## 2022-11-16 NOTE — Progress Notes (Signed)
Mobility Specialist - Progress Note   11/16/22 0956  Mobility  Activity Ambulated with assistance in hallway;Stood at bedside;Dangled on edge of bed  Level of Assistance Contact guard assist, steadying assist  Assistive Device None  Distance Ambulated (ft) 120 ft  Activity Response Tolerated well  Mobility Referral Yes  $Mobility charge 1 Mobility  Mobility Specialist Start Time (ACUTE ONLY) 0854  Mobility Specialist Stop Time (ACUTE ONLY) 0917  Mobility Specialist Time Calculation (min) (ACUTE ONLY) 23 min   Pt supine in bed on RA upon arrival. Pt completes bed mobility indep with extra time. Pt able to maintain seated balance for 1~2 minutes indep. Pt STS and ambulates in hallway CGA with no LOB noted but does furniture surf throughout ambulation.Pt defers AD. Pt returns to EOB with needs in reach and bed alarm active.   Terrilyn Saver  Mobility Specialist  11/16/22 9:58 AM

## 2022-11-16 NOTE — Progress Notes (Addendum)
Dr Lucianne Muss and Nehemiah Settle RN made aware that pt [name] HR 35-40s, pt states that he feels fine, no complaints, Dr Lucianne Muss acknowledged- continue to monitor

## 2022-11-17 ENCOUNTER — Other Ambulatory Visit: Payer: Self-pay

## 2022-11-17 ENCOUNTER — Other Ambulatory Visit: Payer: Self-pay | Admitting: Student

## 2022-11-17 DIAGNOSIS — N390 Urinary tract infection, site not specified: Secondary | ICD-10-CM | POA: Diagnosis not present

## 2022-11-17 DIAGNOSIS — R262 Difficulty in walking, not elsewhere classified: Secondary | ICD-10-CM

## 2022-11-17 DIAGNOSIS — I160 Hypertensive urgency: Secondary | ICD-10-CM | POA: Diagnosis not present

## 2022-11-17 LAB — BASIC METABOLIC PANEL
Anion gap: 9 (ref 5–15)
BUN: 7 mg/dL — ABNORMAL LOW (ref 8–23)
CO2: 26 mmol/L (ref 22–32)
Calcium: 9 mg/dL (ref 8.9–10.3)
Chloride: 98 mmol/L (ref 98–111)
Creatinine, Ser: 0.96 mg/dL (ref 0.61–1.24)
GFR, Estimated: >60 mL/min (ref >60)
Glucose, Bld: 83 mg/dL (ref 70–99)
Potassium: 3.8 mmol/L (ref 3.5–5.1)
Sodium: 133 mmol/L — ABNORMAL LOW (ref 135–145)

## 2022-11-17 LAB — CBC
HCT: 41.4 % (ref 39.0–52.0)
Hemoglobin: 13.9 g/dL (ref 13.0–17.0)
MCH: 32.3 pg (ref 26.0–34.0)
MCHC: 33.6 g/dL (ref 30.0–36.0)
MCV: 96.1 fL (ref 80.0–100.0)
Platelets: 162 10*3/uL (ref 150–400)
RBC: 4.31 MIL/uL (ref 4.22–5.81)
RDW: 12.9 % (ref 11.5–15.5)
WBC: 4.6 10*3/uL (ref 4.0–10.5)
nRBC: 0 % (ref 0.0–0.2)

## 2022-11-17 LAB — MAGNESIUM: Magnesium: 1.9 mg/dL (ref 1.7–2.4)

## 2022-11-17 LAB — LEVETIRACETAM LEVEL: Levetiracetam Lvl: <2 ug/mL — ABNORMAL LOW (ref 10.0–40.0)

## 2022-11-17 LAB — PHOSPHORUS: Phosphorus: 4.1 mg/dL (ref 2.5–4.6)

## 2022-11-17 MED ORDER — VITAMIN B-1 100 MG PO TABS
100.0000 mg | ORAL_TABLET | Freq: Every day | ORAL | 0 refills | Status: AC
  Filled 2022-11-17: qty 30, 30d supply, fill #0

## 2022-11-17 MED ORDER — VITAMIN D (ERGOCALCIFEROL) 1.25 MG (50000 UNIT) PO CAPS
50000.0000 [IU] | ORAL_CAPSULE | ORAL | 0 refills | Status: AC
  Filled 2022-11-17: qty 12, 84d supply, fill #0

## 2022-11-17 MED ORDER — ADULT MULTIVITAMIN W/MINERALS CH: 1.0000 | ORAL_TABLET | Freq: Every day | ORAL | Status: AC

## 2022-11-17 MED ORDER — AMLODIPINE BESYLATE 10 MG PO TABS
10.0000 mg | ORAL_TABLET | Freq: Every day | ORAL | 11 refills | Status: DC
  Filled 2022-11-17: qty 30, 30d supply, fill #0

## 2022-11-17 NOTE — Discharge Summary (Signed)
[facility] Discharge Summary   Patient: [name] HQI:[id number]  PCP: Pcp, No  Date of admission: 11/15/2022   Date of discharge:  11/17/2022     Discharge Diagnoses:  Principal Problem:   UTI (urinary tract infection) Active Problems:   Weakness   ETOH abuse   Hyperlipemia   Hypertension   Seizure disorder (HCC)   Admitted From: Home Disposition:  Home with [facility]  Recommendations for Outpatient Follow-up:  PCP: In 1 week Follow up LABS/TEST:     Diet recommendation: Cardiac diet  Activity: The patient is advised to gradually reintroduce usual activities, as tolerated  Discharge Condition: stable  Code Status: Full code   History of present illness: As per the H and P dictated on admission Hospital Course:  [name] is a 82 y.o. male with medical history significant of hyperlipidemia, hypertension, alcohol abuse, seizure disorder presenting with weakness, UTI.  Per report, patient with worsening weakness over multiple weeks.  Was diagnosed with a UTI within the past month or so.  Was prescribed course of amoxicillin to which patient only took a partial dose.  Patient denies any chest pain, shortness of breath, abdominal pain.  Positive mild dysuria.  No focal hemiparesis or confusion.  Patient unclear as to what medication he was prescribed for the UTI.  Also 3+ beer drinker daily.  No reported illicit drug use.  Denies any liquor or wine use.  No orthopnea, nausea or vomiting. Presented to the ER afebrile, hemodynamically stable.  Satting well on room air.  White count 7.7, hemoglobin 14, platelets 210, creatinine 1.1 urinalysis indicative of infection.  Chest x-ray today stable.   Assessment and Plan:   # Generalized weakness, unknown cause Suspected UTI, urine culture negative, patient denies any dysuria S/p ceftriaxone, discontinued antibiotics for now TSH 1.9 wnl, ck 140 wnl, UDS negative, CT Head negative for any acute findings Renal US: Status post  left nephrectomy. Right kidney is unremarkable. Mild prostatic enlargement. balder scan to r/o urinary retention.  # Hypertensive urgency and Bradycardia S/p telemetry monitoring, no significant event noticed.  Patient was bradycardia at rest but heart rate improved with ambulation, patient remained asymptomatic. Resumed home dose lisinopril 5 mg p.o. daily and Started amlodipine 10 mg p.o. daily.  Discontinued propranolol Home medication due to bradycardia.  Patient was advised to monitor BP and heart rate at home and follow with PCP to titrate medications accordingly.  # ETOH abuse Patient reports drinking at least 2-3 large beers daily S/p Prophylactic CIWA protocol. ETOH level <10 and UDS negative. Continue multivitamin, thiamine and folate for 30 days. On 6/20 patient was experiencing some withdrawal symptoms in the afternoon as per RN and patient was requesting beer with meals.  He does not want to quit alcohol, does not want to detox. # HLD on statin, LDL 116 # Vitamin D deficiency: started vitamin D 50,000 units p.o. weekly, follow with PCP to repeat vitamin D level after 3 to 6 months. BPH on flomax Continued rest of his home medications on discharge.  Body mass index is 22.81 kg/m.  Nutrition Interventions:  - Patient was instructed, not to drive, operate heavy machinery, perform activities at heights, swimming or participation in water activities or provide baby sitting services while on Pain, Sleep and Anxiety Medications; until his outpatient Physician has advised to do so again.  - Also recommended to not to take more than prescribed Pain, Sleep and Anxiety Medications.  Patient was seen by physical therapy, who recommended  Home health, which was arranged. On the day of the discharge the patient's vitals were stable, and no other acute medical condition were reported by patient. the patient was felt safe to be discharge at Home with Home health.  Consultants: None Procedures:  None  Discharge Exam: General: Appear in no distress, no Rash; Oral Mucosa Clear, moist. Cardiovascular: S1 and S2 Present, no Murmur, Respiratory: normal respiratory effort, Bilateral Air entry present and no Crackles, no wheezes Abdomen: Bowel Sound present, Soft and no tenderness, no hernia Extremities: no Pedal edema, no calf tenderness Neurology: alert and oriented to time, place, and person affect appropriate.  Filed Weights   11/15/22 1000  Weight: 68 kg   Vitals:   11/16/22 2111 11/17/22 0746  BP: 132/87 (!) 150/84  Pulse: 62 (!) 50  Resp:  15  Temp: (!) 97.5 F (36.4 C) 97.7 F (36.5 C)  SpO2: (!) 84% 98%    DISCHARGE MEDICATION: Allergies as of 11/17/2022       Reactions   Ibuprofen Other (See Comments)   Other Reaction(s): GASTRITIS        Medication List     STOP taking these medications    chlordiazePOXIDE 25 MG capsule Commonly known as: LIBRIUM   levETIRAcetam 500 MG tablet Commonly known as: Keppra   pantoprazole 40 MG tablet Commonly known as: PROTONIX   propranolol ER 60 MG 24 hr capsule Commonly known as: INDERAL LA       TAKE these medications    amLODipine 10 MG tablet Commonly known as: NORVASC Take 1 tablet (10 mg total) by mouth daily. Start taking on: November 18, 2022   aspirin EC 81 MG tablet Take 1 tablet (81 mg total) by mouth daily.   atorvastatin 80 MG tablet Commonly known as: LIPITOR Take 40 mg by mouth at bedtime.   FLUoxetine 20 MG capsule Commonly known as: PROZAC Take 40 mg by mouth every morning.   gabapentin 300 MG capsule Commonly known as: NEURONTIN Take 900 mg by mouth 3 (three) times daily.   lisinopril 5 MG tablet Commonly known as: ZESTRIL Take 5 mg by mouth daily.   multivitamin with minerals Tabs tablet Take 1 tablet by mouth daily. Start taking on: November 18, 2022   omeprazole 40 MG capsule Commonly known as: PRILOSEC Take 40 mg by mouth daily.   tamsulosin 0.4 MG Caps capsule Commonly  known as: FLOMAX Take 0.8 mg by mouth daily after supper. *Take 30 minutes after evening meal*   thiamine 100 MG tablet Commonly known as: Vitamin B-1 Take 1 tablet (100 mg total) by mouth daily. Start taking on: November 18, 2022   traMADol 50 MG tablet Commonly known as: ULTRAM Take 25 mg by mouth 2 (two) times daily as needed for moderate pain.   traZODone 100 MG tablet Commonly known as: DESYREL Take 200 mg by mouth at bedtime.   Vitamin D (Ergocalciferol) 1.25 MG (50000 UNIT) Caps capsule Commonly known as: DRISDOL Take 1 capsule (50,000 Units total) by mouth every 7 (seven) days. Start taking on: November 23, 2022       Allergies  Allergen Reactions   Ibuprofen Other (See Comments)    Other Reaction(s): GASTRITIS   Discharge Instructions     Call MD for:  difficulty breathing, headache or visual disturbances   Complete by: As directed    Call MD for:  extreme fatigue   Complete by: As directed    Call MD for:  persistant dizziness or light-headedness  Complete by: As directed    Call MD for:  persistant nausea and vomiting   Complete by: As directed    Call MD for:  severe uncontrolled pain   Complete by: As directed    Call MD for:  temperature >100.4   Complete by: As directed    Diet - low sodium heart healthy   Complete by: As directed    Discharge instructions   Complete by: As directed    F/u with PCP in 1 wk   Increase activity slowly   Complete by: As directed        The results of significant diagnostics from this hospitalization (including imaging, microbiology, ancillary and laboratory) are listed below for reference.    Significant Diagnostic Studies: ECHOCARDIOGRAM COMPLETE  Result Date: 11/16/2022    ECHOCARDIOGRAM REPORT   Patient Name:   JED KUTCH Date of Exam: 11/16/2022 Medical Rec #:  [id number]      Height:       68.0 in Accession #:    [id number]     Weight:       150.0 lb Date of Birth:  February 18, 1941      BSA:          1.809 m Patient  Age:    82 years       BP:           180/70 mmHg Patient Gender: M              HR:           47 bpm. 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 Arrythmias:RBBB; Risk Factors:Hypertension                  and Dyslipidemia.  Sonographer:     Cristela Blue Referring Phys:  [id number] Gillis Santa Diagnosing Phys: Julien Nordmann MD IMPRESSIONS  1. Left ventricular ejection fraction, by estimation, is 55 to 60%. The left ventricle has normal function. The left ventricle has no regional wall motion abnormalities. Left ventricular diastolic parameters are indeterminate.  2. Right ventricular systolic function is normal. The right ventricular size is normal. There is normal pulmonary artery systolic pressure. The estimated right ventricular systolic pressure is 29.0 mmHg.  3. The mitral valve is normal in structure. Mild to moderate mitral valve regurgitation. No evidence of mitral stenosis.  4. The aortic valve is normal in structure. There is mild calcification of the aortic valve. Aortic valve regurgitation is not visualized. Aortic valve sclerosis is present, with no evidence of aortic valve stenosis.  5. The inferior vena cava is normal in size with greater than 50% respiratory variability, suggesting right atrial pressure of 3 mmHg. FINDINGS  Left Ventricle: Left ventricular ejection fraction, by estimation, is 55 to 60%. The left ventricle has normal function. The left ventricle has no regional wall motion abnormalities. The left ventricular internal cavity size was normal in size. There is  no left ventricular hypertrophy. Left ventricular diastolic parameters are indeterminate. Right Ventricle: The right ventricular size is normal. No increase in right ventricular wall thickness. Right ventricular systolic function is  normal. There is normal pulmonary artery systolic pressure. The tricuspid regurgitant velocity is 2.45 m/s, and  with  an assumed right atrial pressure of 5 mmHg, the estimated right ventricular systolic pressure is 29.0 mmHg. Left Atrium: Left atrial size was normal in size. Right Atrium: Right atrial size was normal in size. Pericardium: There is no evidence of pericardial effusion. Mitral Valve: The mitral valve is normal in structure. Mild to moderate mitral valve regurgitation. No evidence of mitral valve stenosis. Tricuspid Valve: The tricuspid valve is normal in structure. Tricuspid valve regurgitation is not demonstrated. No evidence of tricuspid stenosis. Aortic Valve: The aortic valve is normal in structure. There is mild calcification of the aortic valve. Aortic valve regurgitation is not visualized. Aortic valve sclerosis is present, with no evidence of aortic valve stenosis. Aortic valve mean gradient  measures 3.0 mmHg. Aortic valve peak gradient measures 6.0 mmHg. Aortic valve area, by VTI measures 2.00 cm. Pulmonic Valve: The pulmonic valve was normal in structure. Pulmonic valve regurgitation is not visualized. No evidence of pulmonic stenosis. Aorta: The aortic root is normal in size and structure. Venous: The inferior vena cava is normal in size with greater than 50% respiratory variability, suggesting right atrial pressure of 3 mmHg. IAS/Shunts: No atrial level shunt detected by color flow Doppler.  LEFT VENTRICLE PLAX 2D LVIDd:         4.70 cm   Diastology LVIDs:         2.80 cm   LV e' medial:    8.05 cm/s LV PW:         1.10 cm   LV E/e' medial:  9.1 LV IVS:        1.10 cm   LV e' lateral:   9.90 cm/s LVOT diam:     2.00 cm   LV E/e' lateral: 7.4 LV SV:         55 LV SV Index:   31 LVOT Area:     3.14 cm  RIGHT VENTRICLE RV Basal diam:  2.90 cm RV Mid diam:    2.40 cm RV S prime:     13.60 cm/s TAPSE (M-mode): 3.1 cm LEFT ATRIUM           Index        RIGHT ATRIUM          Index LA diam:       3.10 cm 1.71 cm/m   RA Area:     9.20 cm LA Vol (A4C): 33.3 ml 18.41 ml/m  RA Volume:   15.90 ml 8.79 ml/m  AORTIC VALVE AV Area (Vmax):    2.10 cm AV Area (Vmean):   2.01 cm AV Area (VTI):     2.00 cm AV Vmax:           122.00 cm/s AV Vmean:          75.200 cm/s AV VTI:            0.276 m AV Peak Grad:      6.0 mmHg AV Mean Grad:      3.0 mmHg LVOT Vmax:         81.40 cm/s LVOT Vmean:        48.100 cm/s LVOT VTI:          0.176 m LVOT/AV VTI ratio: 0.64  AORTA Ao Root diam: 3.00 cm MITRAL VALVE               TRICUSPID VALVE MV Area (PHT): 3.39 cm    TR Peak grad:   24.0 mmHg MV Decel Time: 224 msec    TR  Vmax:        245.00 cm/s MV E velocity: 73.40 cm/s MV A velocity: 46.60 cm/s  SHUNTS MV E/A ratio:  1.58        Systemic VTI:  0.18 m                            Systemic Diam: 2.00 cm Julien Nordmann MD Electronically signed by Julien Nordmann MD Signature Date/Time: 11/16/2022/5:21:16 PM    Final    US RENAL  Result Date: 11/16/2022 CLINICAL DATA:  Urinary tract infection. Status post left nephrectomy. EXAM: RENAL / URINARY TRACT ULTRASOUND COMPLETE COMPARISON:  March 09, 2016. FINDINGS: Right Kidney: Renal measurements: 10.2 x 6.0 x 5.7 cm = volume: 181 mL. Echogenicity within normal limits. No mass or hydronephrosis visualized. Status post left nephrectomy. Bladder: Appears normal for degree of bladder distention. Right ureteral jet is visualized. Other: Mild prostatic enlargement is noted. IMPRESSION: Status post left nephrectomy. Right kidney is unremarkable. Mild prostatic enlargement. Electronically Signed   By: Lupita Raider M.D.   On: 11/16/2022 12:53   CT HEAD WO CONTRAST ( )  Result Date: 11/15/2022 CLINICAL DATA:  Mental status change, unknown cause. EXAM: CT HEAD WITHOUT CONTRAST TECHNIQUE: Contiguous axial images were obtained from the base of the skull through the vertex without intravenous contrast. RADIATION DOSE REDUCTION: This exam was performed according to the departmental  dose-optimization program which includes automated exposure control, adjustment of the mA and/or kV according to patient size and/or use of iterative reconstruction technique. COMPARISON:  Head CT 09/30/2018. FINDINGS: Brain: No acute hemorrhage. 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 Cortical gray-white differentiation is otherwise preserved. Prominence of the ventricles and sulci within expected range for age. No hydrocephalus or extra-axial collection. No mass effect or midline shift. Vascular: No hyperdense vessel or unexpected calcification. Skull: No calvarial fracture or suspicious bone lesion. Skull base is unremarkable. Sinuses/Orbits: Unremarkable. Other: None. IMPRESSION: 1. No acute intracranial abnormality. 2. Unchanged moderate chronic small-vessel disease. Electronically Signed   By: Orvan Falconer M.D.   On: 11/15/2022 15:23   DG Chest Port 1 View  Result Date: 11/15/2022 CLINICAL DATA:  Weakness. EXAM: PORTABLE CHEST 1 VIEW COMPARISON:  Chest x-ray Sep 30, 2018. FINDINGS: The heart size and mediastinal contours are within normal limits. Both lungs are clear. No visible pleural effusions or pneumothorax. No acute osseous abnormality. IMPRESSION: No active disease. Electronically Signed   By: Feliberto Harts M.D.   On: 11/15/2022 10:26    Microbiology: Recent Results (from the past 240 hour(s))  Urine Culture     Status: None   Collection Time: 11/15/22 10:41 AM   Specimen: Urine, Clean Catch  Result Value Ref Range Status   Specimen Description   Final    URINE, CLEAN CATCH Performed at [facility], [address], [place], [place] [address]    Special Requests   Final    NONE Reflexed from [id number] Performed at [facility], [address] [place] [place], [place] [address]    Culture   Final    NO GROWTH Performed at [facility], [address], [place], [place] [address]    Report  Status 11/16/2022 FINAL  Final     Labs: CBC: Recent Labs  Lab 11/15/22 1320 11/16/22 0635 11/17/22 0609  WBC 7.7 5.7 4.6  NEUTROABS 4.9  --   --   HGB 13.6  13.6 13.9  HCT 40.5 41.4 41.4  MCV 94.8 97.2 96.1  PLT 210 147* 162   Basic Metabolic Panel: Recent Labs  Lab 11/15/22 1320 11/16/22 0635 11/16/22 0913 11/17/22 0609  NA 132* 134*  --  133*  K 4.2 3.8  --  3.8  CL 96* 101  --  98  CO2 26 24  --  26  GLUCOSE 84 83  --  83  BUN 12 9  --  7*  CREATININE 1.10 0.96  --  0.96  CALCIUM 8.6* 8.6*  --  9.0  MG  --   --  1.9 1.9  PHOS  --   --  3.6 4.1   Liver Function Tests: Recent Labs  Lab 11/15/22 1320 11/16/22 0635  AST 24 20  ALT 12 12  ALKPHOS 73 66  BILITOT 1.6* 1.1  PROT 7.2 6.4*  ALBUMIN 3.7 3.1*   No results for input(s): "LIPASE", "AMYLASE" in the last 168 hours. No results for input(s): "AMMONIA" in the last 168 hours. Cardiac Enzymes: Recent Labs  Lab 11/15/22 1544  CKTOTAL 140   BNP (last 3 results) No results for input(s): "BNP" in the last 8760 hours. CBG: No results for input(s): "GLUCAP" in the last 168 hours.  Time spent: 35 minutes  Signed:  Gillis Santa  [facility]  11/17/2022 11:15 AM

## 2022-11-17 NOTE — Progress Notes (Signed)
Physical Therapy Treatment Patient Details Name: [name] MRN: [id number] DOB: [birth year] Today's Date: 11/17/2022   History of Present Illness Patient is a 82 year old male with presenting with weakness worsening over multiple weeks, UTI.    PT Comments    Patient agreeable to PT, wants to return home. He walked with and without rolling walker. Gait kinematics and safety are improved with using rolling walker with continued use recommended. Heart rate 77-79 bpm with ambulation and in the 50's at rest. PT will continue to follow while in the hospital to maximize independence.    Recommendations for follow up therapy are one component of a multi-disciplinary discharge planning process, led by the attending physician.  Recommendations may be updated based on patient status, additional functional criteria and insurance authorization.  Follow Up Recommendations       Assistance Recommended at Discharge Set up Supervision/Assistance  Patient can return home with the following Help with stairs or ramp for entrance;Assist for transportation   Equipment Recommendations  None recommended by PT    Recommendations for Other Services       Precautions / Restrictions Precautions Precautions: Fall Precaution Comments: monitor HR Restrictions Weight Bearing Restrictions: No     Mobility  Bed Mobility               General bed mobility comments: not tested. patient sitting up on arrival and post session    Transfers Overall transfer level: Needs assistance Equipment used: None Transfers: Sit to/from Stand Sit to Stand: Min guard           General transfer comment: several standing bouts performed from chair with arms. Min guard for safety. increased time required for to achieve standing    Ambulation/Gait Ambulation/Gait assistance: Supervision, Min guard Gait Distance (Feet): 175 Feet Assistive device: Rolling walker (2 wheels) Gait Pattern/deviations:  Step-through pattern Gait velocity: decreased     General Gait Details: patient ambulated a short distance without walker, generally unsteady with decreased step length and reaching out for support. patient ambulated a lap in the hallway with rolling walker with improved gait pattern and safety. recommend to continue using rolling walker for safety which patient has at home.   Stairs             Wheelchair Mobility    Modified Rankin (Stroke Patients Only)       Balance                                            Cognition Arousal/Alertness: Awake/alert Behavior During Therapy: Impulsive Overall Cognitive Status: No family/caregiver present to determine baseline cognitive functioning                                 General Comments: patient is able to follow single step commands with mild delay. cooperative with session        Exercises      General Comments General comments (skin integrity, edema, etc.): heart rate in the 50's at rest. heart rate 77-79 bpm with ambulation. no dizziness is reported with mobility      Pertinent Vitals/Pain      Home Living                          Prior Function  PT Goals (current goals can now be found in the care plan section) Acute Rehab PT Goals Patient Stated Goal: to go home PT Goal Formulation: With patient Time For Goal Achievement: 11/30/22 Potential to Achieve Goals: Good Progress towards PT goals: Progressing toward goals    Frequency    Min 3X/week      PT Plan Current plan remains appropriate    Co-evaluation              AM-PAC PT "6 Clicks" Mobility   Outcome Measure  Help needed turning from your back to your side while in a flat bed without using bedrails?: None Help needed moving from lying on your back to sitting on the side of a flat bed without using bedrails?: A Little Help needed moving to and from a bed to a chair (including a  wheelchair)?: A Little Help needed standing up from a chair using your arms (e.g., wheelchair or bedside chair)?: A Little Help needed to walk in hospital room?: A Little Help needed climbing 3-5 steps with a railing? : A Little 6 Click Score: 19    End of Session   Activity Tolerance: Patient tolerated treatment well Patient left: in chair;with call bell/phone within reach Nurse Communication: Mobility status (heart rate at rest and with mobility) PT Visit Diagnosis: Muscle weakness (generalized) (M62.81);Unsteadiness on feet (R26.81)     Time: 1000-1014 PT Time Calculation (min) (ACUTE ONLY): 14 min  Charges:  $Therapeutic Activity: 8-22 mins                    Donna Bernard, PT, MPT    Ina Homes 11/17/2022, 12:39 PM

## 2022-11-17 NOTE — Care Management CC44 (Signed)
Condition Code 44 Documentation Completed  Patient Details  Name: [name] MRN: [id number] Date of Birth: June 08, 1940   Condition Code 44 given:  Yes Patient signature on Condition Code 44 notice:  Yes Documentation of 2 MD's agreement:  Yes Code 44 added to claim:  Yes  Reviewed with son [name] by phone. Unable to reach wife [name]. Copy provided for family's records.   [name] E [name], LCSW 11/17/2022, 9:07 AM

## 2022-11-17 NOTE — TOC Progression Note (Addendum)
Transition of Care [facility]) - Progression Note    Patient Details  Name: [name] MRN: [id number] Date of Birth: 18-Jan-1941  Transition of [facility]) CM/SW Contact  Liliana Cline, LCSW Phone Number: 11/17/2022, 9:09 AM  Clinical Narrative:    Son [name] confirms plan to return home with [facility] to his knowledge.   1:40- Asked MD for [facility] OT RN orders. [facility] aware patient was discharged today.         Expected Discharge Plan and Services                                               Social Determinants of Health (SDOH) Interventions SDOH Screenings   Tobacco Use: Low Risk  (11/15/2022)    Readmission Risk Interventions     No data to display

## 2023-05-04 ENCOUNTER — Emergency Department: Payer: No Typology Code available for payment source

## 2023-05-04 ENCOUNTER — Other Ambulatory Visit: Payer: Self-pay

## 2023-05-04 ENCOUNTER — Encounter: Payer: Self-pay | Admitting: Emergency Medicine

## 2023-05-04 ENCOUNTER — Emergency Department
Admission: EM | Admit: 2023-05-04 | Discharge: 2023-05-05 | Discharge status: Against Medical Advice | Payer: No Typology Code available for payment source | Attending: Emergency Medicine | Admitting: Emergency Medicine

## 2023-05-04 DIAGNOSIS — R519 Headache, unspecified: Secondary | ICD-10-CM | POA: Diagnosis not present

## 2023-05-04 DIAGNOSIS — R55 Syncope and collapse: Secondary | ICD-10-CM | POA: Insufficient documentation

## 2023-05-04 DIAGNOSIS — Z5321 Procedure and treatment not carried out due to patient leaving prior to being seen by health care provider: Secondary | ICD-10-CM | POA: Insufficient documentation

## 2023-05-04 DIAGNOSIS — R42 Dizziness and giddiness: Secondary | ICD-10-CM | POA: Insufficient documentation

## 2023-05-04 LAB — BASIC METABOLIC PANEL
Anion gap: 10 (ref 5–15)
BUN: 11 mg/dL (ref 8–23)
CO2: 22 mmol/L (ref 22–32)
Calcium: 8.5 mg/dL — ABNORMAL LOW (ref 8.9–10.3)
Chloride: 102 mmol/L (ref 98–111)
Creatinine, Ser: 1.29 mg/dL — ABNORMAL HIGH (ref 0.61–1.24)
GFR, Estimated: 56 mL/min — ABNORMAL LOW (ref >60)
Glucose, Bld: 85 mg/dL (ref 70–99)
Potassium: 3.9 mmol/L (ref 3.5–5.1)
Sodium: 134 mmol/L — ABNORMAL LOW (ref 135–145)

## 2023-05-04 LAB — CBC
HCT: 41.1 % (ref 39.0–52.0)
Hemoglobin: 14.1 g/dL (ref 13.0–17.0)
MCH: 32.3 pg (ref 26.0–34.0)
MCHC: 34.3 g/dL (ref 30.0–36.0)
MCV: 94.3 fL (ref 80.0–100.0)
Platelets: 181 10*3/uL (ref 150–400)
RBC: 4.36 MIL/uL (ref 4.22–5.81)
RDW: 14.5 % (ref 11.5–15.5)
WBC: 6.6 10*3/uL (ref 4.0–10.5)
nRBC: 0 % (ref 0.0–0.2)

## 2023-05-04 LAB — TROPONIN I (HIGH SENSITIVITY): Troponin I (High Sensitivity): 10 ng/L (ref <18)

## 2023-05-04 NOTE — ED Triage Notes (Signed)
Pt comes via [facility] from home with c/o syncopal episode. Pt was seating at dinner table when this happened. Pt doesn't recall events.   BP-100/60 and then dropped to 70/40 from laying to sitting.  CBG 110  Pt given fluids and states little better now.

## 2023-05-04 NOTE — ED Triage Notes (Signed)
Patient to ED via [facility] from home for syncope. Patient was sitting in a chair when episode occurred. Patient does not remember event. Patient states he feels dizzy at this time. Initally hypotensive with EMS.

## 2023-05-04 NOTE — ED Provider Triage Note (Signed)
Emergency Medicine Provider Triage Evaluation Note  [name] , a 82 y.o. male  was evaluated in triage.  Pt complains of syncope from a sitting position. He was given fluids by EMS now reports dizziness and headache. Did not fall or hit his head.  Review of Systems  Positive: Dizziness, headache Negative:   Physical Exam  BP 124/77 (BP Location: Right Arm)   Pulse (!) 57   Temp 97.7 F (36.5 C) (Oral)   Resp 18   Ht 5\' 8"  (1.727 m)   Wt 72.6 kg   SpO2 99%   BMI 24.33 kg/m  Gen:   Awake, no distress   Resp:  Normal effort  MSK:   Moves extremities without difficulty  Other:    Medical Decision Making  Medically screening exam initiated at 6:00 PM.  Appropriate orders placed.  [name] was informed that the remainder of the evaluation will be completed by another provider, this initial triage assessment does not replace that evaluation, and the importance of remaining in the ED until their evaluation is complete.     Cameron Ali, PA-C 05/04/23 1803

## 2023-12-25 ENCOUNTER — Ambulatory Visit (INDEPENDENT_AMBULATORY_CARE_PROVIDER_SITE_OTHER): Admitting: Orthopaedic Surgery

## 2023-12-25 ENCOUNTER — Other Ambulatory Visit: Payer: Self-pay | Admitting: Orthopaedic Surgery

## 2023-12-25 ENCOUNTER — Other Ambulatory Visit (INDEPENDENT_AMBULATORY_CARE_PROVIDER_SITE_OTHER)

## 2023-12-25 DIAGNOSIS — M25561 Pain in right knee: Secondary | ICD-10-CM | POA: Diagnosis not present

## 2023-12-25 DIAGNOSIS — M1711 Unilateral primary osteoarthritis, right knee: Secondary | ICD-10-CM

## 2023-12-25 DIAGNOSIS — G8929 Other chronic pain: Secondary | ICD-10-CM

## 2023-12-25 MED ORDER — LIDOCAINE HCL 1 % IJ SOLN
2.0000 mL | INTRAMUSCULAR | Status: AC | PRN
  Administered 2023-12-25: 2 mL

## 2023-12-25 MED ORDER — BUPIVACAINE HCL 0.5 % IJ SOLN
2.0000 mL | INTRAMUSCULAR | Status: AC | PRN
  Administered 2023-12-25: 2 mL via INTRA_ARTICULAR

## 2023-12-25 MED ORDER — METHYLPREDNISOLONE ACETATE 40 MG/ML IJ SUSP
40.0000 mg | INTRAMUSCULAR | Status: AC | PRN
  Administered 2023-12-25: 40 mg via INTRA_ARTICULAR

## 2023-12-25 NOTE — Progress Notes (Signed)
 Office Visit Note   Patient: [name]           Date of Birth: 1940/06/02           MRN: [id number] Visit Date: 12/25/2023              Requested by: Morgan Wilbert HERO, PA-C [address] [place],  [place] [address] PCP: Pcp, No   Assessment & Plan: Visit Diagnoses:  1. Primary osteoarthritis of right knee     Plan: History of Present Illness [name] is an 83 year old male with advanced arthritis who presents with right knee pain.  He is a referral from the [facility].  He has experienced right knee pain for over five years without receiving treatments such as injections, medications, or physical therapy. He uses a cane for ambulation and wears a brace on his left leg due to weakness, which he attributes to a wound sustained in [name]. He denies having a foot drop.    Results RADIOLOGY Knee X-ray: Severe osteoarthritis with bone-on-bone contact  Assessment and Plan Right knee osteoarthritis Chronic advanced osteoarthritis with bone on bone changes. Left leg weakness may affect surgical options. - Administer injection for right knee osteoarthritis. - Consider surgery if symptoms persist despite conservative management.  Follow-Up Instructions: No follow-ups on file.   Orders:  Orders Placed This Encounter  Procedures   Large Joint Inj   No orders of the defined types were placed in this encounter.     Procedures: Large Joint Inj: R knee on 12/25/2023 1:34 PM Indications: pain Details: 22 G needle  Arthrogram: No  Medications: 40 mg methylPREDNISolone  acetate 40 MG/ML; 2 mL lidocaine  1 %; 2 mL bupivacaine  0.5 % Consent was given by the patient. Patient was prepped and draped in the usual sterile fashion.       Clinical Data: No additional findings.   Subjective: Chief Complaint  Patient presents with   Right Knee - Pain    HPI  Review of Systems  Constitutional: Negative.   HENT: Negative.    Eyes: Negative.   Respiratory: Negative.     Cardiovascular: Negative.   Gastrointestinal: Negative.   Endocrine: Negative.   Genitourinary: Negative.   Skin: Negative.   Allergic/Immunologic: Negative.   Neurological: Negative.   Hematological: Negative.   Psychiatric/Behavioral: Negative.    All other systems reviewed and are negative.    Objective: Vital Signs: There were no vitals taken for this visit.  Physical Exam Vitals and nursing note reviewed.  Constitutional:      Appearance: He is well-developed.  HENT:     Head: Normocephalic and atraumatic.  Eyes:     Pupils: Pupils are equal, round, and reactive to light.  Pulmonary:     Effort: Pulmonary effort is normal.  Abdominal:     Palpations: Abdomen is soft.  Musculoskeletal:        General: Normal range of motion.     Cervical back: Neck supple.     Right knee:     Instability Tests: Medial McMurray test negative and lateral McMurray test negative.  Skin:    General: Skin is warm.  Neurological:     Mental Status: He is alert and oriented to person, place, and time.  Psychiatric:        Behavior: Behavior normal.        Thought Content: Thought content normal.        Judgment: Judgment normal.     Right Knee Exam  Muscle Strength  The patient has normal right knee strength.  Tenderness  The patient is experiencing tenderness in the medial joint line.  Range of Motion  The patient has normal right knee ROM.  Tests  McMurray:  Medial - negative Lateral - negative      Specialty Comments:  No specialty comments available.  Imaging: No results found.   PMFS History: Patient Active Problem List   Diagnosis Date Noted   ETOH abuse 11/15/2022   Weakness 11/15/2022   Seizure disorder (HCC) 11/15/2022   UTI (urinary tract infection) 09/30/2018   Alcohol  use 09/30/2018   Hyperlipemia    Hypertension    Hypotension    Metabolic acidosis, normal anion gap (NAG)    Near syncope    H/O unilateral nephrectomy    Bilateral carotid  artery stenosis    Acute on chronic renal failure (HCC) 03/09/2016   Past Medical History:  Diagnosis Date   Acute kidney injury (HCC)    a. 07/2013 in setting of C diff;  b. 02/2016.   C. difficile colitis    a. 07/2013.   EtOH dependence (HCC)    a. 3 cans of beer/day.   Hyperlipemia    Hypertension    Phimosis    a. 09/2014 s/p circumcision.   Prostate cancer (HCC)    a. 2015 s/p radiation.   RBBB    Seizures (HCC)    Solitary kidney    [name].    No family history on file.  Past Surgical History:  Procedure Laterality Date   Abdominal Surgery & Left Nephrectomy     a. 1968 removal of shrapnel and L nephrectomy.   Social History   Occupational History   Not on file  Tobacco Use   Smoking status: Never   Smokeless tobacco: Never  Vaping Use   Vaping status: Never Used  Substance and Sexual Activity   Alcohol  use: Yes    Alcohol /week: 21.0 standard drinks of alcohol     Types: 21 Cans of beer per week    Comment: 3 12oz cans a day-- states he has cut back to 2 12 oz cans daily   Drug use: No   Sexual activity: Not on file

## 2024-03-08 ENCOUNTER — Other Ambulatory Visit: Payer: Self-pay

## 2024-03-08 ENCOUNTER — Observation Stay
Admission: EM | Admit: 2024-03-08 | Discharge: 2024-03-10 | Disposition: A | Discharge status: Home Health | Attending: Student | Admitting: Student

## 2024-03-08 ENCOUNTER — Emergency Department

## 2024-03-08 DIAGNOSIS — R531 Weakness: Secondary | ICD-10-CM | POA: Diagnosis present

## 2024-03-08 DIAGNOSIS — G40909 Epilepsy, unspecified, not intractable, without status epilepticus: Secondary | ICD-10-CM | POA: Insufficient documentation

## 2024-03-08 DIAGNOSIS — Z905 Acquired absence of kidney: Secondary | ICD-10-CM | POA: Diagnosis not present

## 2024-03-08 DIAGNOSIS — M25571 Pain in right ankle and joints of right foot: Secondary | ICD-10-CM | POA: Diagnosis not present

## 2024-03-08 DIAGNOSIS — E871 Hypo-osmolality and hyponatremia: Secondary | ICD-10-CM | POA: Diagnosis not present

## 2024-03-08 DIAGNOSIS — J324 Chronic pansinusitis: Secondary | ICD-10-CM | POA: Insufficient documentation

## 2024-03-08 DIAGNOSIS — I10 Essential (primary) hypertension: Secondary | ICD-10-CM | POA: Diagnosis not present

## 2024-03-08 DIAGNOSIS — F1090 Alcohol use, unspecified, uncomplicated: Secondary | ICD-10-CM | POA: Diagnosis not present

## 2024-03-08 DIAGNOSIS — N390 Urinary tract infection, site not specified: Secondary | ICD-10-CM | POA: Diagnosis present

## 2024-03-08 DIAGNOSIS — N3090 Cystitis, unspecified without hematuria: Secondary | ICD-10-CM

## 2024-03-08 LAB — URINALYSIS, ROUTINE W REFLEX MICROSCOPIC
Bilirubin Urine: NEGATIVE
Glucose, UA: 50 mg/dL — AB
Ketones, ur: NEGATIVE mg/dL
Nitrite: NEGATIVE
Protein, ur: NEGATIVE mg/dL
Specific Gravity, Urine: 1.004 — ABNORMAL LOW (ref 1.005–1.030)
WBC, UA: >50 WBC/hpf (ref 0–5)
pH: 7 (ref 5.0–8.0)

## 2024-03-08 LAB — COMPREHENSIVE METABOLIC PANEL WITH GFR
ALT: 12 U/L (ref 0–44)
AST: 19 U/L (ref 15–41)
Albumin: 3.1 g/dL — ABNORMAL LOW (ref 3.5–5.0)
Alkaline Phosphatase: 64 U/L (ref 38–126)
Anion gap: 10 (ref 5–15)
BUN: 7 mg/dL — ABNORMAL LOW (ref 8–23)
CO2: 24 mmol/L (ref 22–32)
Calcium: 8.4 mg/dL — ABNORMAL LOW (ref 8.9–10.3)
Chloride: 93 mmol/L — ABNORMAL LOW (ref 98–111)
Creatinine, Ser: 1 mg/dL (ref 0.61–1.24)
GFR, Estimated: >60 mL/min (ref >60)
Glucose, Bld: 106 mg/dL — ABNORMAL HIGH (ref 70–99)
Potassium: 4.2 mmol/L (ref 3.5–5.1)
Sodium: 127 mmol/L — ABNORMAL LOW (ref 135–145)
Total Bilirubin: 0.8 mg/dL (ref 0.0–1.2)
Total Protein: 6.6 g/dL (ref 6.5–8.1)

## 2024-03-08 LAB — CBC
HCT: 39.3 % (ref 39.0–52.0)
Hemoglobin: 13.3 g/dL (ref 13.0–17.0)
MCH: 32 pg (ref 26.0–34.0)
MCHC: 33.8 g/dL (ref 30.0–36.0)
MCV: 94.7 fL (ref 80.0–100.0)
Platelets: 232 K/uL (ref 150–400)
RBC: 4.15 MIL/uL — ABNORMAL LOW (ref 4.22–5.81)
RDW: 12.5 % (ref 11.5–15.5)
WBC: 5.1 K/uL (ref 4.0–10.5)
nRBC: 0 % (ref 0.0–0.2)

## 2024-03-08 LAB — SODIUM, URINE, RANDOM: Sodium, Ur: 44 mmol/L

## 2024-03-08 LAB — TROPONIN I (HIGH SENSITIVITY): Troponin I (High Sensitivity): 5 ng/L (ref <18)

## 2024-03-08 LAB — CBG MONITORING, ED: Glucose-Capillary: 103 mg/dL — ABNORMAL HIGH (ref 70–99)

## 2024-03-08 LAB — OSMOLALITY, URINE: Osmolality, Ur: 172 mosm/kg — ABNORMAL LOW (ref 300–900)

## 2024-03-08 MED ORDER — MORPHINE SULFATE (PF) 2 MG/ML IV SOLN: 2.0000 mg | INTRAVENOUS | Status: DC | PRN

## 2024-03-08 MED ORDER — SODIUM CHLORIDE 0.9 % IV BOLUS
1000.0000 mL | Freq: Once | INTRAVENOUS | Status: AC
  Administered 2024-03-08: 1000 mL via INTRAVENOUS

## 2024-03-08 MED ORDER — ASPIRIN 81 MG PO TBEC
81.0000 mg | DELAYED_RELEASE_TABLET | Freq: Every day | ORAL | Status: DC
  Administered 2024-03-09 – 2024-03-10 (×2): 81 mg via ORAL
  Filled 2024-03-08 (×2): qty 1

## 2024-03-08 MED ORDER — TRAZODONE HCL 50 MG PO TABS
50.0000 mg | ORAL_TABLET | Freq: Every evening | ORAL | Status: DC | PRN
  Administered 2024-03-09: 50 mg via ORAL
  Filled 2024-03-08: qty 1

## 2024-03-08 MED ORDER — ACETAMINOPHEN 650 MG RE SUPP: 650.0000 mg | Freq: Four times a day (QID) | RECTAL | Status: DC | PRN

## 2024-03-08 MED ORDER — ONDANSETRON HCL 4 MG PO TABS: 4.0000 mg | ORAL_TABLET | Freq: Four times a day (QID) | ORAL | Status: DC | PRN

## 2024-03-08 MED ORDER — ENOXAPARIN SODIUM 40 MG/0.4ML IJ SOSY
40.0000 mg | PREFILLED_SYRINGE | INTRAMUSCULAR | Status: DC
  Administered 2024-03-09: 40 mg via SUBCUTANEOUS
  Filled 2024-03-08: qty 0.4

## 2024-03-08 MED ORDER — SODIUM CHLORIDE 0.9 % IV SOLN
2.0000 g | Freq: Once | INTRAVENOUS | Status: AC
  Administered 2024-03-08: 2 g via INTRAVENOUS
  Filled 2024-03-08: qty 20

## 2024-03-08 MED ORDER — ACETAMINOPHEN 325 MG PO TABS: 650.0000 mg | ORAL_TABLET | Freq: Four times a day (QID) | ORAL | Status: DC | PRN

## 2024-03-08 MED ORDER — ONDANSETRON HCL 4 MG/2ML IJ SOLN: 4.0000 mg | Freq: Four times a day (QID) | INTRAMUSCULAR | Status: DC | PRN

## 2024-03-08 MED ORDER — ATORVASTATIN CALCIUM 20 MG PO TABS
40.0000 mg | ORAL_TABLET | Freq: Every day | ORAL | Status: DC
  Administered 2024-03-09: 40 mg via ORAL
  Filled 2024-03-08: qty 2

## 2024-03-08 MED ORDER — LISINOPRIL 5 MG PO TABS
5.0000 mg | ORAL_TABLET | Freq: Every day | ORAL | Status: DC
  Administered 2024-03-09 – 2024-03-10 (×2): 5 mg via ORAL
  Filled 2024-03-08 (×2): qty 1

## 2024-03-08 MED ORDER — HYDROCODONE-ACETAMINOPHEN 5-325 MG PO TABS
1.0000 | ORAL_TABLET | ORAL | Status: DC | PRN
  Administered 2024-03-09: 2 via ORAL
  Filled 2024-03-08: qty 2

## 2024-03-08 NOTE — Assessment & Plan Note (Signed)
 No acute issues suspected

## 2024-03-08 NOTE — ED Notes (Signed)
 Patient called out to nurses station and reported that he needed to go to the bathroom. Pt assisted to toilet in bathroom. Pt noted to have soiled his home clothing. This RN gave pt paper scrubs,new brief, and nonskid socks. Pt cleaned with bath wipes. Pt also noted to have soiled bed linens. This RN cleaned and replaced pts bed linens. Pt appreciative and given new blankets. Denies further needs at this time.

## 2024-03-08 NOTE — H&P (Signed)
 History and Physical    Patient: [name] FMW:[id number] DOB: Feb 12, 1941 DOA: 03/08/2024 DOS: the patient was seen and examined on 03/08/2024 [facility]  Patient coming from: Home  Chief Complaint:  Chief Complaint  Patient presents with   Weakness    HPI: [name] is a 83 y.o. male with medical history significant for HTN, alcohol  use disorder, seizure disorder, history of nephrectomy being admitted with hyponatremia/UTI after presenting with generalized weakness.  He denies any nausea or vomiting, diarrhea, dysuria.  Denies one-sided weakness numbness or tingling, headache or visual disturbance. Separately patient complains of right ankle pain from an accidental fall a couple weeks ago when he tripped on something. In the ED vitals were within normal limits.  Blood work notable for sodium 127 (baseline 134 )and a urine analysis with large leuks and rare bacteria.  CBC WNL. EKG showed sinus at 67 with possible ST depressions in inferior leads. Troponin 5-6 Patient given an NS bolus, started on ceftriaxone  Admission requested as patient felt uncomfortable going home with how weak he felt     Past Medical History:  Diagnosis Date   Acute kidney injury    a. 07/2013 in setting of C diff;  b. 02/2016.   C. difficile colitis    a. 07/2013.   EtOH dependence (HCC)    a. 3 cans of beer/day.   Hyperlipemia    Hypertension    Phimosis    a. 09/2014 s/p circumcision.   Prostate cancer (HCC)    a. 2015 s/p radiation.   RBBB    Seizures (HCC)    Solitary kidney    [name].   Past Surgical History:  Procedure Laterality Date   Abdominal Surgery & Left Nephrectomy     a. 1968 removal of shrapnel and L nephrectomy.   Social History:  reports that he has never smoked. He has never used smokeless tobacco. He reports current alcohol  use of about 21.0 standard drinks of alcohol  per week. He reports that he does not use drugs.  Allergies   Allergen Reactions   Ibuprofen Other (See Comments)    Other Reaction(s): GASTRITIS    History reviewed. No pertinent family history.  Prior to Admission medications   Medication Sig Start Date End Date Taking? Authorizing Provider  amLODipine  (NORVASC ) 10 MG tablet Take 1 tablet (10 mg total) by mouth daily. 11/18/22 11/18/23  Von Bellis, MD  aspirin  EC 81 MG tablet Take 1 tablet (81 mg total) by mouth daily. 11/17/22   Von Bellis, MD  atorvastatin (LIPITOR) 80 MG tablet Take 40 mg by mouth at bedtime. 03/03/22 03/04/23  [provider]  FLUoxetine (PROZAC) 20 MG capsule Take 40 mg by mouth every morning.    [provider]  gabapentin (NEURONTIN) 300 MG capsule Take 900 mg by mouth 3 (three) times daily. 01/27/22   [provider]  lisinopril  (ZESTRIL ) 5 MG tablet Take 5 mg by mouth daily. 09/27/22 09/28/23  [provider]  Multiple Vitamin (MULTIVITAMIN WITH MINERALS) TABS tablet Take 1 tablet by mouth daily. 11/18/22   Von Bellis, MD  omeprazole (PRILOSEC) 40 MG capsule Take 40 mg by mouth daily. 12/05/21   [provider]  tamsulosin  (FLOMAX ) 0.4 MG CAPS capsule Take 0.8 mg by mouth daily after supper. *Take 30 minutes after evening meal*    [provider]  traZODone (DESYREL) 100 MG tablet Take 200 mg by mouth at bedtime.     [provider]  Physical Exam: Vitals:   03/08/24 1411 03/08/24 1412 03/08/24 1418 03/08/24 1932  BP:  (!) 142/74  123/63  Pulse:  67  81  Resp:  17  16  Temp:   97.9 F (36.6 C) 98.5 F (36.9 C)  TempSrc:   Oral Oral  SpO2:  100%  100%  Weight: 67.9 kg     Height: 5' 8 (1.727 m)      Physical Exam Vitals and nursing note reviewed.  Constitutional:      General: He is not in acute distress. HENT:     Head: Normocephalic and atraumatic.  Cardiovascular:     Rate and Rhythm: Normal rate and regular rhythm.     Heart sounds: Normal heart sounds.  Pulmonary:     Effort: Pulmonary  effort is normal.     Breath sounds: Normal breath sounds.  Abdominal:     Palpations: Abdomen is soft.     Tenderness: There is no abdominal tenderness.  Musculoskeletal:     Comments: Right ankle in bandage  Neurological:     Mental Status: Mental status is at baseline.     Labs on Admission: I have personally reviewed following labs and imaging studies  CBC: Recent Labs  Lab 03/08/24 1947  WBC 5.1  HGB 13.3  HCT 39.3  MCV 94.7  PLT 232   Basic Metabolic Panel: Recent Labs  Lab 03/08/24 1947  NA 127*  K 4.2  CL 93*  CO2 24  GLUCOSE 106*  BUN 7*  CREATININE 1.00  CALCIUM 8.4*   GFR: Estimated Creatinine Clearance: 55.6 mL/min (by C-G formula based on SCr of 1 mg/dL). Liver Function Tests: Recent Labs  Lab 03/08/24 1947  AST 19  ALT 12  ALKPHOS 64  BILITOT 0.8  PROT 6.6  ALBUMIN 3.1*   No results for input(s): LIPASE, AMYLASE in the last 168 hours. No results for input(s): AMMONIA in the last 168 hours. Coagulation Profile: No results for input(s): INR, PROTIME in the last 168 hours. Cardiac Enzymes: No results for input(s): CKTOTAL, CKMB, CKMBINDEX, TROPONINI in the last 168 hours. BNP (last 3 results) No results for input(s): PROBNP in the last 8760 hours. HbA1C: No results for input(s): HGBA1C in the last 72 hours. CBG: Recent Labs  Lab 03/08/24 1935  GLUCAP 103*   Lipid Profile: No results for input(s): CHOL, HDL, LDLCALC, TRIG, CHOLHDL, LDLDIRECT in the last 72 hours. Thyroid  Function Tests: No results for input(s): TSH, T4TOTAL, FREET4, T3FREE, THYROIDAB in the last 72 hours. Anemia Panel: No results for input(s): VITAMINB12, FOLATE, FERRITIN, TIBC, IRON, RETICCTPCT in the last 72 hours. Urine analysis:    Component Value Date/Time   COLORURINE YELLOW (A) 03/08/2024 1947   APPEARANCEUR CLOUDY (A) 03/08/2024 1947   APPEARANCEUR Clear 05/08/2014 1614   LABSPEC 1.004 (L) 03/08/2024  1947   LABSPEC 1.016 05/08/2014 1614   PHURINE 7.0 03/08/2024 1947   GLUCOSEU 50 (A) 03/08/2024 1947   GLUCOSEU Negative 05/08/2014 1614   HGBUR SMALL (A) 03/08/2024 1947   BILIRUBINUR NEGATIVE 03/08/2024 1947   BILIRUBINUR Negative 05/08/2014 1614   KETONESUR NEGATIVE 03/08/2024 1947   PROTEINUR NEGATIVE 03/08/2024 1947   NITRITE NEGATIVE 03/08/2024 1947   LEUKOCYTESUR LARGE (A) 03/08/2024 1947   LEUKOCYTESUR Negative 05/08/2014 1614    Radiological Exams on Admission: CT Head Wo Contrast Result Date: 03/08/2024 CLINICAL DATA:  Near syncope. EXAM: CT HEAD WITHOUT CONTRAST TECHNIQUE: Contiguous axial images were obtained from the base of the skull through the vertex without  intravenous contrast. RADIATION DOSE REDUCTION: This exam was performed according to the departmental dose-optimization program which includes automated exposure control, adjustment of the mA and/or kV according to patient size and/or use of iterative reconstruction technique. COMPARISON:  May 04, 2023 FINDINGS: Brain: There is mild cerebral atrophy with widening of the extra-axial spaces and ventricular dilatation. There are areas of decreased attenuation within the white matter tracts of the supratentorial brain, consistent with microvascular disease changes. Normal basal ganglia. Normal bilateral thalami. Vascular: Moderate to marked severity bilateral cavernous carotid artery calcification is noted. Skull: Normal. Negative for fracture or focal lesion. Sinuses/Orbits: There is marked severity bilateral ethmoid sinus mucosal thickening. Mild bilateral maxillary sinus mucosal thickening is also seen. Other: None. IMPRESSION: 1. No acute intracranial abnormality. 2. Generalized cerebral atrophy with widening of the extra-axial spaces and ventricular dilatation. 3. Bilateral ethmoid and maxillary sinus disease. Electronically Signed   By: Suzen Dials M.D.   On: 03/08/2024 14:31   Data Reviewed for HPI: Relevant  notes from primary care and specialist visits, past discharge summaries as available in EHR, including Care Everywhere. Prior diagnostic testing as pertinent to current admission diagnoses Updated medications and problem lists for reconciliation ED course, including vitals, labs, imaging, treatment and response to treatment Triage notes, nursing and pharmacy notes and ED provider's notes Notable results as noted above in HPI      Assessment and Plan: * Hyponatremia Likely related to alcohol  use versus dehydration Received NS bolus in the ED Will get urine sodium and osmolality and serum sodium Monitor sodium levels  UTI (urinary tract infection) Rocephin  Follow cultures  Generalized weakness Suspect secondary to hyponatremia and UTI Treating as outlined under each problem Can consider PT consult  Alcohol  use disorder CIWA withdrawal protocol  Right ankle pain Pain control  Seizure disorder (HCC) Resume gabapentin pending med rec  H/O unilateral nephrectomy No acute issues suspected  Hypertension Resume home meds   DVT prophylaxis: Lovenox   Consults: none  Advance Care Planning:   Code Status: Prior   Family Communication: none  Disposition Plan: Back to previous home environment  Severity of Illness: The appropriate patient status for this patient is OBSERVATION. Observation status is judged to be reasonable and necessary in order to provide the required intensity of service to ensure the patient's safety. The patient's presenting symptoms, physical exam findings, and initial radiographic and laboratory data in the context of their medical condition is felt to place them at decreased risk for further clinical deterioration. Furthermore, it is anticipated that the patient will be medically stable for discharge from the hospital within 2 midnights of admission.   Author: Delayne LULLA Solian, MD 03/08/2024 10:14 PM  For on call review [email or web address].

## 2024-03-08 NOTE — ED Notes (Signed)
 Pt advised unable to provide a urine specimen at this time.

## 2024-03-08 NOTE — Assessment & Plan Note (Signed)
 Resume gabapentin pending med rec

## 2024-03-08 NOTE — Assessment & Plan Note (Signed)
 Resume home meds

## 2024-03-08 NOTE — ED Notes (Signed)
 Fall risk bundle is currently in place.

## 2024-03-08 NOTE — Assessment & Plan Note (Signed)
 Rocephin   Follow cultures

## 2024-03-08 NOTE — ED Provider Notes (Signed)
 [facility] Provider Note    Event Date/Time   First MD Initiated Contact with Patient 03/08/24 1409     (approximate)   History   Weakness   HPI  [name] is a 83 year old male presenting to the ER for evaluation of weakness.  Patient reports he was walking in the laundry room when he felt weak and had to ask his son for help.  Still feels generally weak.  Denies falling or hitting his head.  Denies numbness, tingling, focal weakness.  Says he has had experiences like this before, unsure cause.  Reviewed discharge summary from 11/17/2022.  At that time patient presented with weakness and suspected UTI.     Physical Exam   Triage Vital Signs: ED Triage Vitals  Encounter Vitals Group     BP 03/08/24 1412 (!) 142/74     Girls Systolic BP Percentile --      Girls Diastolic BP Percentile --      Boys Systolic BP Percentile --      Boys Diastolic BP Percentile --      Pulse Rate 03/08/24 1412 67     Resp 03/08/24 1412 17     Temp --      Temp src --      SpO2 03/08/24 1412 100 %     Weight 03/08/24 1411 149 lb 11.1 oz (67.9 kg)     Height 03/08/24 1411 5' 8 (1.727 m)     Head Circumference --      Peak Flow --      Pain Score 03/08/24 1412 3     Pain Loc --      Pain Education --      Exclude from Growth Chart --     Most recent vital signs: Vitals:   03/08/24 1412 03/08/24 1418  BP: (!) 142/74   Pulse: 67   Resp: 17   Temp:  97.9 F (36.6 C)  SpO2: 100%      General: Awake, interactive  CV:  Good peripheral perfusion Resp:  Unlabored respirations, lungs clear to auscultation Abd:  Nondistended,  soft, nontender, multiple well-healed scars Neuro:  Symmetric facial movement, fluid speech Other:      ED Results / Procedures / Treatments   Labs (all labs ordered are listed, but only abnormal results are displayed) Labs Reviewed  COMPREHENSIVE METABOLIC PANEL WITH GFR  CBC  URINALYSIS, ROUTINE W REFLEX MICROSCOPIC  CBG  MONITORING, ED     EKG EKG independently reviewed and interpreted by myself demonstrates:  EKG demonstrates sinus rhythm at a rate of 67, PR 162, QRS 138, QTc 472, right bundle branch block noted, no STEMI  RADIOLOGY Imaging independently reviewed and interpreted by myself demonstrates:  CT head without acute bleed  Formal Radiology Read:  CT Head Wo Contrast Result Date: 03/08/2024 CLINICAL DATA:  Near syncope. EXAM: CT HEAD WITHOUT CONTRAST TECHNIQUE: Contiguous axial images were obtained from the base of the skull through the vertex without intravenous contrast. RADIATION DOSE REDUCTION: This exam was performed according to the departmental dose-optimization program which includes automated exposure control, adjustment of the mA and/or kV according to patient size and/or use of iterative reconstruction technique. COMPARISON:  May 04, 2023 FINDINGS: Brain: There is mild cerebral atrophy with widening of the extra-axial spaces and ventricular dilatation. There are areas of decreased attenuation within the white matter tracts of the supratentorial brain, consistent with microvascular disease changes. Normal basal ganglia. Normal bilateral thalami. Vascular: Moderate  to marked severity bilateral cavernous carotid artery calcification is noted. Skull: Normal. Negative for fracture or focal lesion. Sinuses/Orbits: There is marked severity bilateral ethmoid sinus mucosal thickening. Mild bilateral maxillary sinus mucosal thickening is also seen. Other: None. IMPRESSION: 1. No acute intracranial abnormality. 2. Generalized cerebral atrophy with widening of the extra-axial spaces and ventricular dilatation. 3. Bilateral ethmoid and maxillary sinus disease. Electronically Signed   By: Suzen Dials M.D.   On: 03/08/2024 14:31    PROCEDURES:  Critical Care performed: No  Procedures   MEDICATIONS ORDERED IN ED: Medications - No data to display   IMPRESSION / MDM / ASSESSMENT AND PLAN / ED  COURSE  I reviewed the triage vital signs and the nursing notes.  Differential diagnosis includes, but is not limited to, anemia, electrolyte abnormality, arrhythmia, intracranial bleed, no focal deficit suggestive of CVA  Patient's presentation is most consistent with acute presentation with potential threat to life or bodily function.  83 year old male presenting to the emergency department for evaluation of generalized weakness.  Stable vitals on presentation.  Will obtain labs, urine, head CT to further evaluate.  Signed out to oncoming physician at 1500 pending completion of workup and disposition.      FINAL CLINICAL IMPRESSION(S) / ED DIAGNOSES   Final diagnoses:  Generalized weakness     Rx / DC Orders   ED Discharge Orders     None        Note:  This document was prepared using Dragon voice recognition software and may include unintentional dictation errors.   Levander Slate, MD 03/08/24 [phone]

## 2024-03-08 NOTE — Assessment & Plan Note (Signed)
 CIWA withdrawal protocol

## 2024-03-08 NOTE — ED Notes (Signed)
 Pt given meal tray and drink pre Dr order.

## 2024-03-08 NOTE — Assessment & Plan Note (Addendum)
 Suspect secondary to hyponatremia and UTI Treating as outlined under each problem Can consider PT consult

## 2024-03-08 NOTE — ED Notes (Signed)
 Called Lab to send phlebotomist. RN attempted 2x and unable to gain Blood work.

## 2024-03-08 NOTE — ED Triage Notes (Signed)
 coming from home. Hx of seizures. none reported today. near syncope and gen. weakness. no allergies. Lower right leg pain. went to the [facility] for it, not broken. Patient AOX3. Unable to answer the current year.

## 2024-03-08 NOTE — ED Notes (Signed)
 Lab contacted and will add on Troponin to existing bloodwork.

## 2024-03-08 NOTE — Assessment & Plan Note (Addendum)
 Likely related to alcohol  use versus dehydration Received NS bolus in the ED Will get urine sodium and osmolality and serum sodium Monitor sodium levels

## 2024-03-09 ENCOUNTER — Encounter: Payer: Self-pay | Admitting: Internal Medicine

## 2024-03-09 DIAGNOSIS — M25571 Pain in right ankle and joints of right foot: Secondary | ICD-10-CM | POA: Insufficient documentation

## 2024-03-09 DIAGNOSIS — E871 Hypo-osmolality and hyponatremia: Secondary | ICD-10-CM | POA: Diagnosis not present

## 2024-03-09 LAB — VITAMIN B12: Vitamin B-12: 270 pg/mL (ref 180–914)

## 2024-03-09 LAB — BASIC METABOLIC PANEL WITH GFR
Anion gap: 8 (ref 5–15)
Anion gap: 7 (ref 5–15)
BUN: 7 mg/dL — ABNORMAL LOW (ref 8–23)
BUN: 7 mg/dL — ABNORMAL LOW (ref 8–23)
CO2: 22 mmol/L (ref 22–32)
CO2: 23 mmol/L (ref 22–32)
Calcium: 8.4 mg/dL — ABNORMAL LOW (ref 8.9–10.3)
Calcium: 8.5 mg/dL — ABNORMAL LOW (ref 8.9–10.3)
Chloride: 100 mmol/L (ref 98–111)
Chloride: 102 mmol/L (ref 98–111)
Creatinine, Ser: 0.76 mg/dL (ref 0.61–1.24)
Creatinine, Ser: 0.92 mg/dL (ref 0.61–1.24)
GFR, Estimated: >60 mL/min (ref >60)
GFR, Estimated: >60 mL/min (ref >60)
Glucose, Bld: 92 mg/dL (ref 70–99)
Glucose, Bld: 86 mg/dL (ref 70–99)
Potassium: 4.4 mmol/L (ref 3.5–5.1)
Potassium: 3.9 mmol/L (ref 3.5–5.1)
Sodium: 130 mmol/L — ABNORMAL LOW (ref 135–145)
Sodium: 132 mmol/L — ABNORMAL LOW (ref 135–145)

## 2024-03-09 LAB — OSMOLALITY: Osmolality: 273 mosm/kg — ABNORMAL LOW (ref 275–295)

## 2024-03-09 LAB — VITAMIN D 25 HYDROXY (VIT D DEFICIENCY, FRACTURES): Vit D, 25-Hydroxy: 38.49 ng/mL (ref 30–100)

## 2024-03-09 LAB — IRON AND TIBC
Iron: 108 ug/dL (ref 45–182)
Saturation Ratios: 42 % — ABNORMAL HIGH (ref 17.9–39.5)
TIBC: 256 ug/dL (ref 250–450)
UIBC: 148 ug/dL

## 2024-03-09 LAB — MAGNESIUM: Magnesium: 2 mg/dL (ref 1.7–2.4)

## 2024-03-09 LAB — PHOSPHORUS: Phosphorus: 3.4 mg/dL (ref 2.5–4.6)

## 2024-03-09 LAB — TROPONIN I (HIGH SENSITIVITY): Troponin I (High Sensitivity): 6 ng/L (ref <18)

## 2024-03-09 LAB — FOLATE: Folate: 11.7 ng/mL (ref >5.9)

## 2024-03-09 LAB — GLUCOSE, CAPILLARY: Glucose-Capillary: 76 mg/dL (ref 70–99)

## 2024-03-09 MED ORDER — HYDROCERIN EX CREA
TOPICAL_CREAM | Freq: Two times a day (BID) | CUTANEOUS | Status: DC
  Administered 2024-03-09: 1 via TOPICAL
  Filled 2024-03-09: qty 113

## 2024-03-09 MED ORDER — SODIUM CHLORIDE 0.9 % IV SOLN
2.0000 g | INTRAVENOUS | Status: DC
  Administered 2024-03-09: 2 g via INTRAVENOUS
  Filled 2024-03-09: qty 20

## 2024-03-09 MED ORDER — GABAPENTIN 300 MG PO CAPS
900.0000 mg | ORAL_CAPSULE | Freq: Three times a day (TID) | ORAL | Status: DC
  Administered 2024-03-09 – 2024-03-10 (×3): 900 mg via ORAL
  Filled 2024-03-09 (×3): qty 3

## 2024-03-09 MED ORDER — OXYCODONE HCL 5 MG PO TABS
5.0000 mg | ORAL_TABLET | Freq: Four times a day (QID) | ORAL | Status: DC | PRN
  Administered 2024-03-10: 5 mg via ORAL
  Filled 2024-03-09: qty 1

## 2024-03-09 MED ORDER — HYDRALAZINE HCL 20 MG/ML IJ SOLN: 10.0000 mg | Freq: Four times a day (QID) | INTRAMUSCULAR | Status: DC | PRN

## 2024-03-09 MED ORDER — HYDRALAZINE HCL 50 MG PO TABS
50.0000 mg | ORAL_TABLET | Freq: Four times a day (QID) | ORAL | Status: DC | PRN
  Administered 2024-03-10: 50 mg via ORAL
  Filled 2024-03-09: qty 1

## 2024-03-09 MED ORDER — ACETAMINOPHEN 325 MG PO TABS
650.0000 mg | ORAL_TABLET | Freq: Three times a day (TID) | ORAL | Status: DC
  Administered 2024-03-09 – 2024-03-10 (×4): 650 mg via ORAL
  Filled 2024-03-09 (×4): qty 2

## 2024-03-09 MED ORDER — SODIUM CHLORIDE 0.9 % IV SOLN: 2.0000 g | Freq: Once | INTRAVENOUS | Status: DC

## 2024-03-09 MED ORDER — MORPHINE SULFATE (PF) 2 MG/ML IV SOLN: 2.0000 mg | INTRAVENOUS | Status: DC | PRN

## 2024-03-09 MED ORDER — FLUOXETINE HCL 20 MG PO CAPS
40.0000 mg | ORAL_CAPSULE | ORAL | Status: DC
  Administered 2024-03-10: 40 mg via ORAL
  Filled 2024-03-09: qty 2

## 2024-03-09 MED ORDER — TAMSULOSIN HCL 0.4 MG PO CAPS
0.8000 mg | ORAL_CAPSULE | Freq: Every day | ORAL | Status: DC
  Administered 2024-03-09: 0.8 mg via ORAL
  Filled 2024-03-09: qty 2

## 2024-03-09 MED ORDER — ORAL CARE MOUTH RINSE: 15.0000 mL | OROMUCOSAL | Status: DC | PRN

## 2024-03-09 NOTE — Evaluation (Signed)
 Occupational Therapy Evaluation Patient Details Name: [name] MRN: [id number] DOB: Feb 22, 1941 Today's Date: 03/09/2024   History of Present Illness   vory D Bruun is a 83 y.o. male with medical history significant for HTN, alcohol  use disorder, seizure disorder, history of nephrectomy being admitted with hyponatremia/UTI after presenting with generalized weakness.  He denies any nausea or vomiting, diarrhea, dysuria.  Denies one-sided weakness numbness or tingling, headache or visual disturbance.  Separately patient complains of right ankle pain from an accidental fall a couple weeks ago when he tripped on something.  Patient reports having it xrayed at [facility] and no fracture.     Clinical Impressions Patient was seen for OT evaluation this date. Prior to hospital admission, patient was independent with ADLs and using rollator within/outside of the home. Patient lives in a single story home with spouse and adult son support. Patient has been hospitalized due to UTI.  Patient agreeable to working with OT this date, performed bed mobility, sit<>stand, SPT to recliner and SPT to [facility] with CGA/SBA. During session, patient having bowel incontinence and required increased time to void and receive assistance from OT with perineal hygiene; patient had difficulty sequencing steps for managing perineal hygiene and required prompting and max A to complete. Patient able to ambulate 80 feet with walker with CGA/SBA without report of pain/discomfort in R ankle and no SOB noted. Patient presents with deficits in overall activity tolerance, balance and gross strength, affecting safe and optimal ADL completion. Patient is currently requiring min A for basic ADLs.  Paient would benefit from skilled OT services to address noted impairments and functional limitations (see below for any additional details) in order to maximize safety and independence while minimizing future risk of falls, injury, and readmission.  Anticipate the need for follow up OT services upon acute hospital DC.      If plan is discharge home, recommend the following:   A little help with walking and/or transfers;A little help with bathing/dressing/bathroom;Assist for transportation;Assistance with cooking/housework     Functional Status Assessment   Patient has had a recent decline in their functional status and demonstrates the ability to make significant improvements in function in a reasonable and predictable amount of time.     Equipment Recommendations   Tub/shower seat     Recommendations for Other Services         Precautions/Restrictions   Precautions Precautions: Fall Recall of Precautions/Restrictions: Intact Restrictions Weight Bearing Restrictions Per Provider Order: No     Mobility Bed Mobility Overal bed mobility: Modified Independent             General bed mobility comments: able to transition from supine to EOB without A    Transfers Overall transfer level: Needs assistance Equipment used: Rolling walker (2 wheels) Transfers: Sit to/from Stand, Bed to chair/wheelchair/BSC Sit to Stand: Contact guard assist, Supervision     Step pivot transfers: Contact guard assist, Supervision            Balance Overall balance assessment: Needs assistance Sitting-balance support: No upper extremity supported, Feet supported Sitting balance-Leahy Scale: Good     Standing balance support: During functional activity Standing balance-Leahy Scale: Fair                             ADL either performed or assessed with clinical judgement   ADL Overall ADL's : Needs assistance/impaired     Grooming: Wash/dry hands;Set up   Upper  Body Bathing: Set up;Supervision/ safety   Lower Body Bathing: Moderate assistance   Upper Body Dressing : Supervision/safety   Lower Body Dressing: Minimal assistance   Toilet Transfer: Minimal assistance   Toileting- Clothing  Manipulation and Hygiene: Minimal assistance       Functional mobility during ADLs: Contact guard assist       Vision         Perception         Praxis         Pertinent Vitals/Pain Pain Assessment Pain Assessment: 0-10 Pain Score: 10-Worst pain ever Pain Location: right ankle Pain Descriptors / Indicators: Aching Pain Intervention(s): Monitored during session, Premedicated before session, Other (comment) (OT notified RN)     Extremity/Trunk Assessment Upper Extremity Assessment Upper Extremity Assessment: Overall WFL for tasks assessed   Lower Extremity Assessment Lower Extremity Assessment: Defer to PT evaluation       Communication Communication Communication: Impaired Factors Affecting Communication: Reduced clarity of speech   Cognition Arousal: Alert Behavior During Therapy: WFL for tasks assessed/performed Cognition: Cognition impaired   Orientation impairments: Nutritional therapist functioning impairment (select all impairments): Problem solving OT - Cognition Comments: difficulty sequencing pericare s/p bowel incontinence, required cues/prompting                 Following commands: Intact       Cueing  General Comments   Cueing Techniques: Verbal cues  incontinent of bowels   Exercises     Shoulder Instructions      Home Living Family/patient expects to be discharged to:: Private residence Living Arrangements: Spouse/significant other;Children Available Help at Discharge: Family Type of Home: House Home Access: Stairs to enter Secretary/administrator of Steps: 3 Entrance Stairs-Rails: Right;Left Home Layout: One level     Bathroom Shower/Tub: Producer, television/film/video: Handicapped height Bathroom Accessibility: Yes How Accessible: Accessible via walker Home Equipment: Rollator (4 wheels);Grab bars - tub/shower          Prior Functioning/Environment Prior Level of Function : History of Falls (last six  months)             Mobility Comments: used rollator at all times ADLs Comments: reports he is independent with ADLs    OT Problem List: Decreased strength;Decreased activity tolerance;Decreased safety awareness;Impaired balance (sitting and/or standing)   OT Treatment/Interventions: Self-care/ADL training;Therapeutic exercise;Energy conservation;DME and/or AE instruction;Balance training      OT Goals(Current goals can be found in the care plan section)   Acute Rehab OT Goals Patient Stated Goal: to sit in the chair OT Goal Formulation: With patient Time For Goal Achievement: 03/23/24 Potential to Achieve Goals: Good ADL Goals Pt Will Perform Grooming: with modified independence;standing Pt Will Perform Lower Body Dressing: with modified independence;sit to/from stand Pt Will Transfer to Toilet: with modified independence;ambulating;regular height toilet Pt Will Perform Toileting - Clothing Manipulation and hygiene: with modified independence;sit to/from stand   OT Frequency:  Min 2X/week    Co-evaluation              AM-PAC OT 6 Clicks Daily Activity     Outcome Measure Help from another person eating meals?: A Little Help from another person taking care of personal grooming?: A Little Help from another person toileting, which includes using toliet, bedpan, or urinal?: A Little Help from another person bathing (including washing, rinsing, drying)?: A Little Help from another person to put on and taking off regular upper body clothing?: A Little  Help from another person to put on and taking off regular lower body clothing?: A Little 6 Click Score: 18   End of Session Equipment Utilized During Treatment: Rolling walker (2 wheels) Nurse Communication: Mobility status;Other (comment) (bowel incontinence)  Activity Tolerance: Patient tolerated treatment well Patient left: in chair;with call bell/phone within reach;with chair alarm set  OT Visit Diagnosis:  Unsteadiness on feet (R26.81);Repeated falls (R29.6);Muscle weakness (generalized) (M62.81);History of falling (Z91.81)                Time: 8980-8947 OT Time Calculation (min): 33 min Charges:  OT General Charges $OT Visit: 1 Visit OT Evaluation $OT Eval Low Complexity: 1 Low OT Treatments $Self Care/Home Management : 23-37 mins  Rogers Clause, OT/L [facility], 03/09/2024

## 2024-03-09 NOTE — Progress Notes (Signed)
 [facility] Progress Note  Patient: [name]    FMW:[id number]  DOA: 03/08/2024     Date of Service: the patient was seen and examined on 03/09/2024  Chief Complaint  Patient presents with   Weakness   Brief hospital course: [name] is a 83 y.o. male with medical history significant for HTN, alcohol  use disorder, seizure disorder, history of nephrectomy being admitted with hyponatremia/UTI after presenting with generalized weakness.  He denies any nausea or vomiting, diarrhea, dysuria.  Denies one-sided weakness numbness or tingling, headache or visual disturbance. Separately patient complains of right ankle pain from an accidental fall a couple weeks ago when he tripped on something. In the ED vitals were within normal limits.  Blood work notable for sodium 127 (baseline 134 )and a urine analysis with large leuks and rare bacteria.  CBC WNL. EKG showed sinus at 67 with possible ST depressions in inferior leads. Troponin 5-6 Patient given an NS bolus, started on ceftriaxone  Admission requested as patient felt uncomfortable going home with how weak he felt     Assessment and Plan:  # Hypotonic hyponatremia Likely related to alcohol  use versus dehydration Serum Osmo 273, low, continue fluid restriction Urine of 9172, urine sodium 44 Received NS bolus in the ED Monitor sodium levels daily Na 127>>132    # UTI (urinary tract infection) Continue Rocephin  Follow cultures    # Alcohol  use disorder CIWA withdrawal protocol   # Right ankle pain Pain control, apply warm compresses   # Seizure disorder (HCC) Resume gabapentin pending med rec   # H/O unilateral nephrectomy No acute issues suspected   # Hypertension Resume home meds Use hydralazine  as needed Monitor BP and titrate medications accordingly  # Generalized weakness Suspect secondary to hyponatremia and UTI Treating as outlined under each problem  Body mass index is 22.76 kg/m.   Interventions:  Diet: Heart healthy diet DVT Prophylaxis: Subcutaneous Lovenox    Advance goals of care discussion: Full code  Family Communication: family was not present at bedside, at the time of interview.  The pt provided permission to discuss medical plan with the family. Opportunity was given to ask question and all questions were answered satisfactorily.   Disposition:  Pt is from home, admitted with hyponatremia, UTI and right ankle pain, difficulty ambulation., still has pain in the right ankle, which precludes a safe discharge. Discharge to home with PT versus SNF, TBD after PT and OT eval, when stable, currently patient is not able to ambulate.  Subjective: No significant events overnight, patient was complaining of pain in the right ankle 9/10 which is not getting better with pain medication which he received in the morning and does not feel comfortable going home today. Will continue to monitor today and get PT and OT involved. TOC consulted for possible placement  Physical Exam: General: NAD, lying comfortably Appear in no distress, affect appropriate Eyes: PERRLA ENT: Oral Mucosa Clear, moist  Neck: no JVD,  Cardiovascular: S1 and S2 Present, no Murmur,  Respiratory: good respiratory effort, Bilateral Air entry equal and Decreased, no Crackles, no wheezes Abdomen: Bowel Sound present, Soft and no tenderness,  Skin: no rashes Extremities: no Pedal edema, no calf tenderness Neurologic: without any new focal findings Gait not checked due to patient safety concerns  Vitals:   03/09/24 0105 03/09/24 0440 03/09/24 0915 03/09/24 1136  BP: (!) 154/70 (!) 147/73 (!) 157/77 128/78  Pulse: 66 60 65 66  Resp: 17 18 18 18   Temp: 98.7  F (37.1 C) 97.9 F (36.6 C) 98.1 F (36.7 C) 98.7 F (37.1 C)  TempSrc: Oral  Oral   SpO2: 100% 100% 100% 99%  Weight:      Height:        Intake/Output Summary (Last 24 hours) at 03/09/2024 1241 Last data filed at 03/09/2024  0400 Gross per 24 hour  Intake 720 ml  Output --  Net 720 ml   Filed Weights   03/08/24 1411  Weight: 67.9 kg    Data Reviewed: I have personally reviewed and interpreted daily labs, tele strips, imagings as discussed above. I reviewed all nursing notes, pharmacy notes, vitals, pertinent old records I have discussed plan of care as described above with RN and patient/family.  CBC: Recent Labs  Lab 03/08/24 1947  WBC 5.1  HGB 13.3  HCT 39.3  MCV 94.7  PLT 232   Basic Metabolic Panel: Recent Labs  Lab 03/08/24 1947 03/09/24 0240 03/09/24 0825  NA 127* 130* 132*  K 4.2 4.4 3.9  CL 93* 100 102  CO2 24 22 23   GLUCOSE 106* 92 86  BUN 7* 7* 7*  CREATININE 1.00 0.76 0.92  CALCIUM 8.4* 8.4* 8.5*  MG  --   --  2.0  PHOS  --   --  3.4    Studies: CT Head Wo Contrast Result Date: 03/08/2024 CLINICAL DATA:  Near syncope. EXAM: CT HEAD WITHOUT CONTRAST TECHNIQUE: Contiguous axial images were obtained from the base of the skull through the vertex without intravenous contrast. RADIATION DOSE REDUCTION: This exam was performed according to the departmental dose-optimization program which includes automated exposure control, adjustment of the mA and/or kV according to patient size and/or use of iterative reconstruction technique. COMPARISON:  May 04, 2023 FINDINGS: Brain: There is mild cerebral atrophy with widening of the extra-axial spaces and ventricular dilatation. There are areas of decreased attenuation within the white matter tracts of the supratentorial brain, consistent with microvascular disease changes. Normal basal ganglia. Normal bilateral thalami. Vascular: Moderate to marked severity bilateral cavernous carotid artery calcification is noted. Skull: Normal. Negative for fracture or focal lesion. Sinuses/Orbits: There is marked severity bilateral ethmoid sinus mucosal thickening. Mild bilateral maxillary sinus mucosal thickening is also seen. Other: None. IMPRESSION: 1.  No acute intracranial abnormality. 2. Generalized cerebral atrophy with widening of the extra-axial spaces and ventricular dilatation. 3. Bilateral ethmoid and maxillary sinus disease. Electronically Signed   By: Suzen Dials M.D.   On: 03/08/2024 14:31    Scheduled Meds:  aspirin  EC  81 mg Oral Daily   atorvastatin  40 mg Oral QHS   enoxaparin  (LOVENOX ) injection  40 mg Subcutaneous Q24H   lisinopril   5 mg Oral Daily   Continuous Infusions:  cefTRIAXone  (ROCEPHIN )  IV     PRN Meds: acetaminophen  **OR** acetaminophen , HYDROcodone -acetaminophen , morphine injection, ondansetron  **OR** ondansetron  (ZOFRAN ) IV, mouth rinse, traZODone  Time spent: 35 minutes  Author: ELVAN SOR. MD [facility] 03/09/2024 12:41 PM  To reach On-call, see care teams to locate the attending and reach out to them via [email or web address]. If 7PM-7AM, please contact night-coverage If you still have difficulty reaching the attending provider, please page the [facility] (Director on Call) for [facility] on amion for assistance.

## 2024-03-09 NOTE — Plan of Care (Signed)

## 2024-03-09 NOTE — Assessment & Plan Note (Signed)
 Pain control

## 2024-03-09 NOTE — Progress Notes (Signed)
 T shirt, shoes

## 2024-03-10 ENCOUNTER — Other Ambulatory Visit: Payer: Self-pay

## 2024-03-10 DIAGNOSIS — E871 Hypo-osmolality and hyponatremia: Secondary | ICD-10-CM | POA: Diagnosis not present

## 2024-03-10 LAB — CBC
HCT: 37.6 % — ABNORMAL LOW (ref 39.0–52.0)
Hemoglobin: 12.8 g/dL — ABNORMAL LOW (ref 13.0–17.0)
MCH: 32.6 pg (ref 26.0–34.0)
MCHC: 34 g/dL (ref 30.0–36.0)
MCV: 95.7 fL (ref 80.0–100.0)
Platelets: 244 K/uL (ref 150–400)
RBC: 3.93 MIL/uL — ABNORMAL LOW (ref 4.22–5.81)
RDW: 13.1 % (ref 11.5–15.5)
WBC: 4.9 K/uL (ref 4.0–10.5)
nRBC: 0 % (ref 0.0–0.2)

## 2024-03-10 LAB — BASIC METABOLIC PANEL WITH GFR
Anion gap: 8 (ref 5–15)
BUN: 8 mg/dL (ref 8–23)
CO2: 27 mmol/L (ref 22–32)
Calcium: 8.7 mg/dL — ABNORMAL LOW (ref 8.9–10.3)
Chloride: 99 mmol/L (ref 98–111)
Creatinine, Ser: 1.22 mg/dL (ref 0.61–1.24)
GFR, Estimated: 60 mL/min — ABNORMAL LOW (ref >60)
Glucose, Bld: 91 mg/dL (ref 70–99)
Potassium: 4 mmol/L (ref 3.5–5.1)
Sodium: 134 mmol/L — ABNORMAL LOW (ref 135–145)

## 2024-03-10 LAB — PHOSPHORUS: Phosphorus: 3.2 mg/dL (ref 2.5–4.6)

## 2024-03-10 LAB — MAGNESIUM: Magnesium: 1.8 mg/dL (ref 1.7–2.4)

## 2024-03-10 MED ORDER — LORAZEPAM 1 MG PO TABS: 1.0000 mg | ORAL_TABLET | ORAL | Status: DC | PRN

## 2024-03-10 MED ORDER — DULCOLAX 5 MG PO TBEC
10.0000 mg | DELAYED_RELEASE_TABLET | Freq: Every day | ORAL | 1 refills | Status: AC | PRN
  Filled 2024-03-10: qty 30, 15d supply, fill #0

## 2024-03-10 MED ORDER — CYANOCOBALAMIN 1000 MCG PO TABS
1000.0000 ug | ORAL_TABLET | Freq: Every day | ORAL | 2 refills | Status: AC
  Filled 2024-03-10: qty 30, 30d supply, fill #0

## 2024-03-10 MED ORDER — VITAMIN B-1 100 MG PO TABS
100.0000 mg | ORAL_TABLET | Freq: Every day | ORAL | 0 refills | Status: AC
  Filled 2024-03-10: qty 30, 30d supply, fill #0

## 2024-03-10 MED ORDER — POLYETHYLENE GLYCOL 3350 17 GM/SCOOP PO POWD
17.0000 g | Freq: Every day | ORAL | 0 refills | Status: AC
  Filled 2024-03-10: qty 238, 14d supply, fill #0

## 2024-03-10 MED ORDER — OXYCODONE HCL 5 MG PO TABS
5.0000 mg | ORAL_TABLET | Freq: Two times a day (BID) | ORAL | 0 refills | Status: AC | PRN
  Filled 2024-03-10: qty 10, 5d supply, fill #0

## 2024-03-10 MED ORDER — VITAMIN B-12 1000 MCG PO TABS
1000.0000 ug | ORAL_TABLET | Freq: Every day | ORAL | Status: DC
Start: 2024-03-12 — End: 2024-03-10

## 2024-03-10 MED ORDER — FOLIC ACID 1 MG PO TABS
1.0000 mg | ORAL_TABLET | Freq: Every day | ORAL | Status: DC
  Administered 2024-03-10: 1 mg via ORAL
  Filled 2024-03-10: qty 1

## 2024-03-10 MED ORDER — LORAZEPAM 2 MG/ML IJ SOLN: 1.0000 mg | INTRAMUSCULAR | Status: DC | PRN

## 2024-03-10 MED ORDER — THIAMINE HCL 100 MG/ML IJ SOLN
100.0000 mg | Freq: Every day | INTRAMUSCULAR | Status: DC
  Filled 2024-03-10: qty 2

## 2024-03-10 MED ORDER — ADULT MULTIVITAMIN W/MINERALS CH
1.0000 | ORAL_TABLET | Freq: Every day | ORAL | Status: DC
  Administered 2024-03-10: 1 via ORAL
  Filled 2024-03-10: qty 1

## 2024-03-10 MED ORDER — CYANOCOBALAMIN 1000 MCG/ML IJ SOLN
1000.0000 ug | Freq: Every day | INTRAMUSCULAR | Status: DC
  Administered 2024-03-10: 1000 ug via INTRAMUSCULAR
  Filled 2024-03-10: qty 1

## 2024-03-10 MED ORDER — THIAMINE MONONITRATE 100 MG PO TABS
100.0000 mg | ORAL_TABLET | Freq: Every day | ORAL | Status: DC
  Administered 2024-03-10: 100 mg via ORAL
  Filled 2024-03-10: qty 1

## 2024-03-10 MED ORDER — AMOXICILLIN-POT CLAVULANATE 875-125 MG PO TABS
1.0000 | ORAL_TABLET | Freq: Two times a day (BID) | ORAL | 0 refills | Status: AC
  Filled 2024-03-10: qty 6, 3d supply, fill #0

## 2024-03-10 NOTE — Discharge Summary (Signed)
 [facility] Discharge Summary   Patient: [name] FMW:[id number]  [facility]  Date of admission: 03/08/2024   Date of discharge:  03/10/2024     Discharge Diagnoses:  Principal Problem:   Hyponatremia Active Problems:   UTI (urinary tract infection)   Generalized weakness   Alcohol  use disorder   Hypertension   H/O unilateral nephrectomy   Seizure disorder (HCC)   Right ankle pain   Admitted From: Home Disposition:  Home   Recommendations for Outpatient Follow-up:  Follow-up with PCP in 1 week. Follow up LABS/TEST:  Check BMP after 1 week.  Vitamin B12 and vitamin D  level after 3 to 6 months   Follow-up Information     [facility] Follow up in 1 week(s).   Contact information: [address] [place] [address] [phone]                Diet recommendation: Cardiac diet  Activity: The patient is advised to gradually reintroduce usual activities, as tolerated  Discharge Condition: stable  Code Status: Full code   History of present illness: As per the H and P dictated on admission.  Hospital Course:  [name] is a 83 y.o. male with medical history significant for HTN, alcohol  use disorder, seizure disorder, history of nephrectomy being admitted with hyponatremia/UTI after presenting with generalized weakness.  He denies any nausea or vomiting, diarrhea, dysuria.  Denies one-sided weakness numbness or tingling, headache or visual disturbance. Separately patient complains of right ankle pain from an accidental fall a couple weeks ago when he tripped on something. In the ED vitals were within normal limits.  Blood work notable for sodium 127 (baseline 134 )and a urine analysis with large leuks and rare bacteria.  CBC WNL. EKG showed sinus at 67 with possible ST depressions in inferior leads. Troponin 5-6 Patient given an NS bolus, started on ceftriaxone  Admission requested as patient felt uncomfortable going  home with how weak he felt      Assessment and Plan:   # Hypotonic hyponatremia Likely related to alcohol  use versus dehydration Serum Osmo 273, low, continue fluid restriction Urine osmolarity 172, urine sodium 44 Received NS bolus in the ED Na 127>>132>134 Clinically patient is stable to discharge today.   # UTI (urinary tract infection) S/p Rocephin .  Urine culture growing viridans Streptococcus.  Similar testing is not available. Patient was discharged on Augmentin twice daily for 3 days.      # Alcohol  use disorder:  s/p CIWA withdrawal protocol.  No withdrawal symptoms noticed during hospital stay   # Right ankle pain Pain control, apply warm compresses   # Seizure disorder: Resume gabapentin    # H/O unilateral nephrectomy: No acute issues suspected   # Hypertension: Resume home meds Use hydralazine  as needed Monitor BP at home and follow-up with PCP to titrate medications accordingly   # Generalized weakness Suspect secondary to hyponatremia and UTI Treating as outlined under each problem   Body mass index is 22.76 kg/m.  Nutrition Interventions:  Patient was ambulatory without any assistance. Patient was seen by physical therapy, who recommended no therapy needed on discharge, patient was seen by TOC as well On the day of the discharge the patient's vitals were stable, and no other acute medical condition were reported by patient. the patient was felt safe to be discharge at Home .  Consultants: None Procedures: None  Discharge Exam: General: Appear in no distress, Oral Mucosa Clear, moist. Cardiovascular: S1 and S2  Present, no Murmur, Respiratory: normal respiratory effort, Bilateral Air entry present and no Crackles, no wheezes Abdomen: Bowel Sound present, Soft and no tenderness. Extremities: no Pedal edema, no calf tenderness Neurology: alert and oriented to time, place, and person affect appropriate.  Filed Weights   03/08/24 1411  Weight: 67.9  kg   Vitals:   03/10/24 0814 03/10/24 1213  BP: (!) 158/77 103/67  Pulse: 62 96  Resp: 16 18  Temp: 97.8 F (36.6 C) 98.6 F (37 C)  SpO2: 100% 100%    DISCHARGE MEDICATION: Allergies as of 03/10/2024   No Known Allergies      Medication List     STOP taking these medications    amLODipine  10 MG tablet Commonly known as: NORVASC        TAKE these medications    amoxicillin-clavulanate 875-125 MG tablet Commonly known as: AUGMENTIN Take 1 tablet by mouth 2 (two) times daily for 3 days.   aspirin  EC 81 MG tablet Take 1 tablet (81 mg total) by mouth daily.   atorvastatin 80 MG tablet Commonly known as: LIPITOR Take 40 mg by mouth at bedtime.   cyanocobalamin 1000 MCG tablet Take 1 tablet (1,000 mcg total) by mouth daily. Start taking on: March 12, 2024   Dulcolax 5 MG EC tablet Generic drug: bisacodyl  Take 2 tablets (10 mg total) by mouth daily as needed for moderate constipation.   FLUoxetine 20 MG capsule Commonly known as: PROZAC Take 40 mg by mouth every morning.   gabapentin 300 MG capsule Commonly known as: NEURONTIN Take 900 mg by mouth 3 (three) times daily.   lisinopril  5 MG tablet Commonly known as: ZESTRIL  Take 5 mg by mouth daily.   multivitamin with minerals Tabs tablet Take 1 tablet by mouth daily.   omeprazole 40 MG capsule Commonly known as: PRILOSEC Take 40 mg by mouth daily.   oxyCODONE  5 MG immediate release tablet Commonly known as: Oxy IR/ROXICODONE  Take 1 tablet (5 mg total) by mouth 2 (two) times daily as needed for up to 5 days for severe pain (pain score 7-10).   polyethylene glycol 17 g packet Commonly known as: MiraLax Take 17 g by mouth daily.   tamsulosin  0.4 MG Caps capsule Commonly known as: FLOMAX  Take 0.8 mg by mouth daily after supper. *Take 30 minutes after evening meal*   thiamine  100 MG tablet Commonly known as: Vitamin B-1 Take 1 tablet (100 mg total) by mouth daily. Start taking on: March 11, 2024   traZODone 100 MG tablet Commonly known as: DESYREL Take 200 mg by mouth at bedtime.       No Known Allergies Discharge Instructions     Call MD for:  difficulty breathing, headache or visual disturbances   Complete by: As directed    Call MD for:  persistant dizziness or light-headedness   Complete by: As directed    Call MD for:  persistant nausea and vomiting   Complete by: As directed    Call MD for:  redness, tenderness, or signs of infection (pain, swelling, redness, odor or green/yellow discharge around incision site)   Complete by: As directed    Call MD for:  severe uncontrolled pain   Complete by: As directed    Call MD for:  temperature >100.4   Complete by: As directed    Diet general   Complete by: As directed    Discharge instructions   Complete by: As directed    Follow-up with PCP in 1  week. Check BMP after 1 week.  Vitamin B12 and vitamin D  level after 3 to 6 months   Increase activity slowly   Complete by: As directed        The results of significant diagnostics from this hospitalization (including imaging, microbiology, ancillary and laboratory) are listed below for reference.    Significant Diagnostic Studies: CT Head Wo Contrast Result Date: 03/08/2024 CLINICAL DATA:  Near syncope. EXAM: CT HEAD WITHOUT CONTRAST TECHNIQUE: Contiguous axial images were obtained from the base of the skull through the vertex without intravenous contrast. RADIATION DOSE REDUCTION: This exam was performed according to the departmental dose-optimization program which includes automated exposure control, adjustment of the mA and/or kV according to patient size and/or use of iterative reconstruction technique. COMPARISON:  May 04, 2023 FINDINGS: Brain: There is mild cerebral atrophy with widening of the extra-axial spaces and ventricular dilatation. There are areas of decreased attenuation within the white matter tracts of the supratentorial brain, consistent with  microvascular disease changes. Normal basal ganglia. Normal bilateral thalami. Vascular: Moderate to marked severity bilateral cavernous carotid artery calcification is noted. Skull: Normal. Negative for fracture or focal lesion. Sinuses/Orbits: There is marked severity bilateral ethmoid sinus mucosal thickening. Mild bilateral maxillary sinus mucosal thickening is also seen. Other: None. IMPRESSION: 1. No acute intracranial abnormality. 2. Generalized cerebral atrophy with widening of the extra-axial spaces and ventricular dilatation. 3. Bilateral ethmoid and maxillary sinus disease. Electronically Signed   By: Suzen Dials M.D.   On: 03/08/2024 14:31    Microbiology: Recent Results (from the past 240 hours)  Urine Culture     Status: None (Preliminary result)   Collection Time: 03/08/24  7:47 PM   Specimen: Urine, Clean Catch  Result Value Ref Range Status   Specimen Description   Final    URINE, CLEAN CATCH Performed at [facility], [address] [place], [place] [address]    Special Requests   Final    NONE Performed at [facility], [address] [place], [place] [address]    Culture   Final    CULTURE REINCUBATED FOR BETTER GROWTH Performed at [facility], [address], [place], [place] [address]    Report Status PENDING  Incomplete     Labs: CBC: Recent Labs  Lab 03/08/24 1947 03/10/24 0511  WBC 5.1 4.9  HGB 13.3 12.8*  HCT 39.3 37.6*  MCV 94.7 95.7  PLT 232 244   Basic Metabolic Panel: Recent Labs  Lab 03/08/24 1947 03/09/24 0240 03/09/24 0825 03/10/24 0511  NA 127* 130* 132* 134*  K 4.2 4.4 3.9 4.0  CL 93* 100 102 99  CO2 24 22 23 27   GLUCOSE 106* 92 86 91  BUN 7* 7* 7* 8  CREATININE 1.00 0.76 0.92 1.22  CALCIUM 8.4* 8.4* 8.5* 8.7*  MG  --   --  2.0 1.8  PHOS  --   --  3.4 3.2   Liver Function Tests: Recent Labs  Lab 03/08/24 1947  AST 19  ALT 12  ALKPHOS 64  BILITOT 0.8  PROT 6.6  ALBUMIN 3.1*   No  results for input(s): LIPASE, AMYLASE in the last 168 hours. No results for input(s): AMMONIA in the last 168 hours. Cardiac Enzymes: No results for input(s): CKTOTAL, CKMB, CKMBINDEX, TROPONINI in the last 168 hours. BNP (last 3 results) No results for input(s): BNP in the last 8760 hours. CBG: Recent Labs  Lab 03/08/24 1935 03/09/24 1702  GLUCAP 103* 76  Time spent: 35 minutes  Signed:  Elvan Sor  [facility] 03/10/2024 2:32 PM

## 2024-03-10 NOTE — Evaluation (Signed)
 Physical Therapy Evaluation Patient Details Name: [name] MRN: [id number] DOB: 1940/12/08 Today's Date: 03/10/2024  History of Present Illness  Pt is an 83 y/o M admitted on 03/08/24 after presenting with c/o generalized weakness. Pt is being treated from hyponatremia, UTI. PMH: HTN, alcohol  use disorder, seizure disorder, nephrectomy  Clinical Impression  Pt seen for PT evaluation with pt agreeable to tx. Pt reports prior to admission he was residing with wife & son, living in 1 level home with 3 steps to enter with B rails. Pt reports he ambulates with SPC at all times but has RW & rollator at home, denies falls in the past 6 months. On this date, pt is able to complete sit>stand with education re: hand placement, ambulate 1 lap around nurses station with RW & CGA fade to supervision, no LOB. Pt performed 5x sit<>stand from recliner without BUE support with focus on BLE strengthening & endurance training. At end of session pt states thanks for getting me up. Recommend ongoing PT services to progress gait with LRAD, stair negotiation, balance.      If plan is discharge home, recommend the following: A little help with walking and/or transfers;A little help with bathing/dressing/bathroom;Assistance with cooking/housework;Assist for transportation;Help with stairs or ramp for entrance   Can travel by private vehicle        Equipment Recommendations None recommended by PT  Recommendations for Other Services       Functional Status Assessment Patient has had a recent decline in their functional status and demonstrates the ability to make significant improvements in function in a reasonable and predictable amount of time.     Precautions / Restrictions Precautions Precautions: Fall Recall of Precautions/Restrictions: Intact Restrictions Weight Bearing Restrictions Per Provider Order: No      Mobility  Bed Mobility Overal bed mobility: Modified Independent (supine>sit, exit L  side of bed with HOB slightly elevated, bed rails)                  Transfers Overall transfer level: Needs assistance Equipment used: Rolling walker (2 wheels) Transfers: Sit to/from Stand Sit to Stand: Contact guard assist, Supervision           General transfer comment: education/cuing re: hand positioning during transfers sit<>stand    Ambulation/Gait Ambulation/Gait assistance: Supervision, Contact guard assist Gait Distance (Feet): 160 Feet Assistive device: Rolling walker (2 wheels) Gait Pattern/deviations: Step-through pattern, Decreased step length - right, Decreased stride length, Decreased step length - left Gait velocity: decreased     General Gait Details: ambulated around nurses station with RW & CGA fade to supervision, no LOB  Stairs            Wheelchair Mobility     Tilt Bed    Modified Rankin (Stroke Patients Only)       Balance Overall balance assessment: Needs assistance Sitting-balance support: Feet supported Sitting balance-Leahy Scale: Good     Standing balance support: During functional activity, Bilateral upper extremity supported, Reliant on assistive device for balance Standing balance-Leahy Scale: Fair                               Pertinent Vitals/Pain Pain Assessment Pain Assessment: No/denies pain    Home Living Family/patient expects to be discharged to:: Private residence Living Arrangements: Spouse/significant other;Children Available Help at Discharge: Family Type of Home: House Home Access: Stairs to enter Entrance Stairs-Rails: Left;Right;Can reach both Entrance Stairs-Number of Steps:  3   Home Layout: One level Home Equipment: Rollator (4 wheels);Grab bars - tub/shower;Rolling Walker (2 wheels);Cane - single point      Prior Function               Mobility Comments: Reports he's ambulatory with SPC at all times (told OT yesterday he ambulated with rollator), denies falls in the past  6 months       Extremity/Trunk Assessment   Upper Extremity Assessment Upper Extremity Assessment: Overall WFL for tasks assessed    Lower Extremity Assessment Lower Extremity Assessment: Overall WFL for tasks assessed;Generalized weakness       Communication   Communication Communication: Impaired Factors Affecting Communication: Reduced clarity of speech (speaks at low volume)    Cognition Arousal: Alert Behavior During Therapy: WFL for tasks assessed/performed   PT - Cognitive impairments: No apparent impairments                         Following commands: Intact       Cueing Cueing Techniques: Verbal cues     General Comments      Exercises Other Exercises Other Exercises: Pt performed 5x sit<>stand without BUE support with CGA with focus on BLE strengthening & endurance training; pt leaning posteriorly on recliner with BLE some.   Assessment/Plan    PT Assessment Patient needs continued PT services  PT Problem List Decreased strength;Decreased balance;Decreased mobility;Decreased knowledge of use of DME       PT Treatment Interventions DME instruction;Balance training;Gait training;Neuromuscular re-education;Stair training;Therapeutic activities;Therapeutic exercise;Manual techniques;Functional mobility training;Patient/family education    PT Goals (Current goals can be found in the Care Plan section)  Acute Rehab PT Goals Patient Stated Goal: none stated PT Goal Formulation: With patient Time For Goal Achievement: 03/24/24 Potential to Achieve Goals: Good    Frequency Min 1X/week     Co-evaluation               AM-PAC PT 6 Clicks Mobility  Outcome Measure Help needed turning from your back to your side while in a flat bed without using bedrails?: None Help needed moving from lying on your back to sitting on the side of a flat bed without using bedrails?: A Little Help needed moving to and from a bed to a chair (including a  wheelchair)?: A Little Help needed standing up from a chair using your arms (e.g., wheelchair or bedside chair)?: A Little Help needed to walk in hospital room?: A Little Help needed climbing 3-5 steps with a railing? : A Little 6 Click Score: 19    End of Session   Activity Tolerance: Patient tolerated treatment well Patient left: in chair;with chair alarm set;with call bell/phone within reach   PT Visit Diagnosis: Muscle weakness (generalized) (M62.81);Unsteadiness on feet (R26.81)    Time: 8861-8850 PT Time Calculation (min) (ACUTE ONLY): 11 min   Charges:   PT Evaluation $PT Eval Low Complexity: 1 Low   PT General Charges $$ ACUTE PT VISIT: 1 Visit         Richerd Pinal, PT, DPT 03/10/24, 11:56 AM   Richerd CHRISTELLA Pinal 03/10/2024, 11:54 AM

## 2024-03-10 NOTE — Discharge Instructions (Signed)
 [facility]  [phone]

## 2024-03-10 NOTE — Plan of Care (Signed)

## 2024-03-10 NOTE — Progress Notes (Signed)
 Mobility Specialist - Progress Note   03/10/24 1400  Mobility  Activity Ambulated with assistance  Level of Assistance Contact guard assist, steadying assist  Assistive Device Front wheel walker  Distance Ambulated (ft) 200 ft  Activity Response Tolerated well  Mobility visit 1 Mobility  Mobility Specialist Start Time (ACUTE ONLY) 1406  Mobility Specialist Stop Time (ACUTE ONLY) 1427  Mobility Specialist Time Calculation (min) (ACUTE ONLY) 21 min   Pt was in recliner upon entry. Pt agreed to mobility. Pt is able to STS with minA and CGA and 2 [employer]. Pt did state right ankle soreness. Pt ambulated well with 2 WW and CGA for precaution. Pt did not need recovery break during activity. After activity pt returned to room. Pt repositioned in recliner with needs in reach and chair alarm on.  Clem Rodes Mobility Specialist 03/10/24, 2:42 PM

## 2024-03-10 NOTE — Care Management Obs Status (Signed)
 MEDICARE OBSERVATION STATUS NOTIFICATION   Patient Details  Name: [name] MRN: [id number] Date of Birth: 1940/10/14   Medicare Observation Status Notification Given:  Yes    [name] 03/10/2024, 10:51 AM

## 2024-03-10 NOTE — Progress Notes (Signed)
 The patient has pulled out his PIV access. He also was attempting to open the Alaris pump. Pulling on lines/tubes. Please initiate the CIWA protocol orders for this patient.There is now a large duffel bag on a chair beside his bed.  This bag was not with the patient at admission to the unit. He denies that the bag as well as the contents are his.

## 2024-03-12 LAB — URINE CULTURE: Culture: >=100000 — AB

## 2024-03-31 ENCOUNTER — Encounter: Payer: Self-pay | Admitting: Radiology
